# Patient Record
Sex: Female | Born: 1935 | Race: White | Hispanic: No | Marital: Single | Smoking: Never smoker
Health system: Southern US, Community
[De-identification: ages and names within clinical notes are randomized; demographics above are authoritative.]

## PROBLEM LIST (undated history)

## (undated) DIAGNOSIS — M1990 Unspecified osteoarthritis, unspecified site: Secondary | ICD-10-CM

## (undated) DIAGNOSIS — E119 Type 2 diabetes mellitus without complications: Secondary | ICD-10-CM

## (undated) DIAGNOSIS — I10 Essential (primary) hypertension: Secondary | ICD-10-CM

## (undated) DIAGNOSIS — C801 Malignant (primary) neoplasm, unspecified: Secondary | ICD-10-CM

## (undated) HISTORY — PX: TONSILLECTOMY: SUR1361

## (undated) HISTORY — PX: ABDOMINAL HYSTERECTOMY: SHX81

---

## 1986-06-17 HISTORY — PX: BREAST SURGERY: SHX581

## 1998-02-02 ENCOUNTER — Emergency Department (HOSPITAL_COMMUNITY): Admission: EM | Admit: 1998-02-02 | Discharge: 1998-02-02 | Payer: Self-pay | Admitting: Family Medicine

## 2012-06-08 ENCOUNTER — Emergency Department (HOSPITAL_COMMUNITY): Payer: Medicare Other

## 2012-06-08 ENCOUNTER — Encounter (HOSPITAL_COMMUNITY): Payer: Self-pay | Admitting: Emergency Medicine

## 2012-06-08 ENCOUNTER — Observation Stay (HOSPITAL_COMMUNITY)
Admission: EM | Admit: 2012-06-08 | Discharge: 2012-06-11 | DRG: 193 | Disposition: A | Discharge status: Home / Self Care | Payer: Medicare Other | Attending: Internal Medicine | Admitting: Internal Medicine

## 2012-06-08 DIAGNOSIS — J189 Pneumonia, unspecified organism: Principal | ICD-10-CM | POA: Insufficient documentation

## 2012-06-08 DIAGNOSIS — Z9221 Personal history of antineoplastic chemotherapy: Secondary | ICD-10-CM | POA: Insufficient documentation

## 2012-06-08 DIAGNOSIS — Z9010 Acquired absence of unspecified breast and nipple: Secondary | ICD-10-CM | POA: Insufficient documentation

## 2012-06-08 DIAGNOSIS — D72829 Elevated white blood cell count, unspecified: Secondary | ICD-10-CM

## 2012-06-08 DIAGNOSIS — R4182 Altered mental status, unspecified: Secondary | ICD-10-CM

## 2012-06-08 DIAGNOSIS — Z7982 Long term (current) use of aspirin: Secondary | ICD-10-CM | POA: Insufficient documentation

## 2012-06-08 DIAGNOSIS — I10 Essential (primary) hypertension: Secondary | ICD-10-CM | POA: Diagnosis present

## 2012-06-08 DIAGNOSIS — Z853 Personal history of malignant neoplasm of breast: Secondary | ICD-10-CM | POA: Insufficient documentation

## 2012-06-08 DIAGNOSIS — E785 Hyperlipidemia, unspecified: Secondary | ICD-10-CM | POA: Insufficient documentation

## 2012-06-08 DIAGNOSIS — G9340 Encephalopathy, unspecified: Secondary | ICD-10-CM | POA: Insufficient documentation

## 2012-06-08 DIAGNOSIS — Z923 Personal history of irradiation: Secondary | ICD-10-CM | POA: Insufficient documentation

## 2012-06-08 DIAGNOSIS — E119 Type 2 diabetes mellitus without complications: Secondary | ICD-10-CM | POA: Diagnosis present

## 2012-06-08 HISTORY — DX: Essential (primary) hypertension: I10

## 2012-06-08 HISTORY — DX: Malignant (primary) neoplasm, unspecified: C80.1

## 2012-06-08 HISTORY — DX: Unspecified osteoarthritis, unspecified site: M19.90

## 2012-06-08 HISTORY — DX: Type 2 diabetes mellitus without complications: E11.9

## 2012-06-08 LAB — COMPREHENSIVE METABOLIC PANEL
AST: 17 U/L (ref 0–37)
Albumin: 3.5 g/dL (ref 3.5–5.2)
Alkaline Phosphatase: 54 U/L (ref 39–117)
BUN: 18 mg/dL (ref 6–23)
CO2: 22 mEq/L (ref 19–32)
Chloride: 98 mEq/L (ref 96–112)
GFR calc non Af Amer: 45 mL/min — ABNORMAL LOW (ref >90)
Potassium: 3.5 mEq/L (ref 3.5–5.1)
Total Bilirubin: 0.5 mg/dL (ref 0.3–1.2)

## 2012-06-08 LAB — DIFFERENTIAL
Basophils Absolute: 0 10*3/uL (ref 0.0–0.1)
Basophils Relative: 0 % (ref 0–1)
Neutro Abs: 9.4 10*3/uL — ABNORMAL HIGH (ref 1.7–7.7)
Neutrophils Relative %: 74 % (ref 43–77)

## 2012-06-08 LAB — URINE MICROSCOPIC-ADD ON

## 2012-06-08 LAB — URINALYSIS, ROUTINE W REFLEX MICROSCOPIC
Glucose, UA: NEGATIVE mg/dL
Ketones, ur: 15 mg/dL — AB
Protein, ur: NEGATIVE mg/dL
Urobilinogen, UA: 0.2 mg/dL (ref 0.0–1.0)

## 2012-06-08 LAB — CBC
HCT: 39.1 % (ref 36.0–46.0)
MCV: 95.1 fL (ref 78.0–100.0)
RBC: 4.11 MIL/uL (ref 3.87–5.11)
RDW: 13.5 % (ref 11.5–15.5)
WBC: 15.2 10*3/uL — ABNORMAL HIGH (ref 4.0–10.5)

## 2012-06-08 MED ORDER — DEXTROSE 5 % IV SOLN
1.0000 g | Freq: Once | INTRAVENOUS | Status: AC
  Administered 2012-06-08: 1 g via INTRAVENOUS
  Filled 2012-06-08: qty 10

## 2012-06-08 MED ORDER — AZITHROMYCIN 500 MG IV SOLR
500.0000 mg | Freq: Once | INTRAVENOUS | Status: AC
  Administered 2012-06-08: 500 mg via INTRAVENOUS
  Filled 2012-06-08: qty 500

## 2012-06-08 MED ORDER — ACETAMINOPHEN 650 MG RE SUPP
650.0000 mg | Freq: Once | RECTAL | Status: AC
  Administered 2012-06-08: 650 mg via RECTAL
  Filled 2012-06-08: qty 1

## 2012-06-08 MED ORDER — SODIUM CHLORIDE 0.9 % IV BOLUS (SEPSIS)
500.0000 mL | Freq: Once | INTRAVENOUS | Status: AC
  Administered 2012-06-08: 500 mL via INTRAVENOUS

## 2012-06-08 MED ORDER — SODIUM CHLORIDE 0.9 % IV SOLN
Freq: Once | INTRAVENOUS | Status: AC
  Administered 2012-06-08: via INTRAVENOUS

## 2012-06-08 NOTE — ED Notes (Signed)
Pt ambulated to restroom with no assistance. ?

## 2012-06-08 NOTE — ED Notes (Signed)
Pt returned from radiology. Lab at bedside to collect blood.

## 2012-06-08 NOTE — ED Notes (Signed)
Per EMS - pt started having difficulty putting together words. LSN 545pm today. No facial droop, equal hand grips. No urinary symptoms. Pt A&Ox4. Pt's brother was concerned bc pt was able to put words together and normal she has no difficulty. No slurred speech. Family reports hx of stroke, pt denies. BP 124/99 HR 90 nsr. CBG 136. Pt c/o HA for a couple of days. Daughter has the flu.

## 2012-06-08 NOTE — ED Notes (Signed)
Pt's brother reports he talked with pt on phone around 530pm when he noticed she couldn't remember anything and wasn't able to get her words out. Pt's ex husband reports that he brought her lunch around noon and she was normal at that point. Pt reports she didn't see anyone in between noon and talking on the phone at 530pm.

## 2012-06-09 ENCOUNTER — Encounter (HOSPITAL_COMMUNITY): Payer: Self-pay | Admitting: Internal Medicine

## 2012-06-09 DIAGNOSIS — E785 Hyperlipidemia, unspecified: Secondary | ICD-10-CM | POA: Diagnosis present

## 2012-06-09 DIAGNOSIS — I10 Essential (primary) hypertension: Secondary | ICD-10-CM | POA: Diagnosis present

## 2012-06-09 DIAGNOSIS — G9340 Encephalopathy, unspecified: Secondary | ICD-10-CM | POA: Diagnosis present

## 2012-06-09 DIAGNOSIS — J189 Pneumonia, unspecified organism: Secondary | ICD-10-CM | POA: Diagnosis present

## 2012-06-09 DIAGNOSIS — E119 Type 2 diabetes mellitus without complications: Secondary | ICD-10-CM

## 2012-06-09 DIAGNOSIS — Z853 Personal history of malignant neoplasm of breast: Secondary | ICD-10-CM

## 2012-06-09 LAB — CBC WITH DIFFERENTIAL/PLATELET
Basophils Relative: 0 % (ref 0–1)
Hemoglobin: 12.5 g/dL (ref 12.0–15.0)
Lymphocytes Relative: 11 % — ABNORMAL LOW (ref 12–46)
Lymphs Abs: 1.4 10*3/uL (ref 0.7–4.0)
Monocytes Relative: 11 % (ref 3–12)
Neutro Abs: 9 10*3/uL — ABNORMAL HIGH (ref 1.7–7.7)
Neutrophils Relative %: 74 % (ref 43–77)
RBC: 3.81 MIL/uL — ABNORMAL LOW (ref 3.87–5.11)
WBC: 12.1 10*3/uL — ABNORMAL HIGH (ref 4.0–10.5)

## 2012-06-09 LAB — COMPREHENSIVE METABOLIC PANEL
CO2: 24 mEq/L (ref 19–32)
Calcium: 9 mg/dL (ref 8.4–10.5)
Creatinine, Ser: 0.95 mg/dL (ref 0.50–1.10)
GFR calc Af Amer: 65 mL/min — ABNORMAL LOW (ref >90)
GFR calc non Af Amer: 56 mL/min — ABNORMAL LOW (ref >90)
Glucose, Bld: 150 mg/dL — ABNORMAL HIGH (ref 70–99)
Total Protein: 7.5 g/dL (ref 6.0–8.3)

## 2012-06-09 LAB — INFLUENZA PANEL BY PCR (TYPE A & B)
H1N1 flu by pcr: NOT DETECTED
Influenza A By PCR: NEGATIVE
Influenza B By PCR: NEGATIVE

## 2012-06-09 LAB — GLUCOSE, CAPILLARY
Glucose-Capillary: 172 mg/dL — ABNORMAL HIGH (ref 70–99)
Glucose-Capillary: 179 mg/dL — ABNORMAL HIGH (ref 70–99)
Glucose-Capillary: 123 mg/dL — ABNORMAL HIGH (ref 70–99)

## 2012-06-09 MED ORDER — PROPRANOLOL HCL ER BEADS 120 MG PO CP24: 120.0000 mg | ORAL_CAPSULE | Freq: Every day | ORAL | Status: DC

## 2012-06-09 MED ORDER — INSULIN GLARGINE 100 UNIT/ML ~~LOC~~ SOLN
20.0000 [IU] | Freq: Every day | SUBCUTANEOUS | Status: DC
  Administered 2012-06-09 – 2012-06-10 (×2): 20 [IU] via SUBCUTANEOUS

## 2012-06-09 MED ORDER — PANTOPRAZOLE SODIUM 40 MG PO TBEC
40.0000 mg | DELAYED_RELEASE_TABLET | Freq: Every day | ORAL | Status: DC
  Administered 2012-06-09 – 2012-06-10 (×2): 40 mg via ORAL
  Filled 2012-06-09: qty 1

## 2012-06-09 MED ORDER — MOMETASONE FURO-FORMOTEROL FUM 100-5 MCG/ACT IN AERO
2.0000 | INHALATION_SPRAY | Freq: Two times a day (BID) | RESPIRATORY_TRACT | Status: DC
  Administered 2012-06-09 – 2012-06-11 (×5): 2 via RESPIRATORY_TRACT
  Filled 2012-06-09: qty 8.8

## 2012-06-09 MED ORDER — ENOXAPARIN SODIUM 40 MG/0.4ML ~~LOC~~ SOLN
40.0000 mg | SUBCUTANEOUS | Status: DC
  Administered 2012-06-09 – 2012-06-10 (×2): 40 mg via SUBCUTANEOUS
  Filled 2012-06-09 (×3): qty 0.4

## 2012-06-09 MED ORDER — ONDANSETRON HCL 4 MG/2ML IJ SOLN: 4.0000 mg | Freq: Four times a day (QID) | INTRAMUSCULAR | Status: DC | PRN

## 2012-06-09 MED ORDER — ACETAMINOPHEN 325 MG PO TABS
650.0000 mg | ORAL_TABLET | Freq: Four times a day (QID) | ORAL | Status: DC | PRN
  Administered 2012-06-09 – 2012-06-10 (×2): 650 mg via ORAL
  Filled 2012-06-09 (×2): qty 2

## 2012-06-09 MED ORDER — ACETAMINOPHEN 650 MG RE SUPP: 650.0000 mg | Freq: Four times a day (QID) | RECTAL | Status: DC | PRN

## 2012-06-09 MED ORDER — LETROZOLE 2.5 MG PO TABS
2.5000 mg | ORAL_TABLET | Freq: Every day | ORAL | Status: DC
  Administered 2012-06-09 – 2012-06-10 (×2): 2.5 mg via ORAL
  Filled 2012-06-09 (×3): qty 1

## 2012-06-09 MED ORDER — POTASSIUM CHLORIDE CRYS ER 20 MEQ PO TBCR
40.0000 meq | EXTENDED_RELEASE_TABLET | Freq: Two times a day (BID) | ORAL | Status: AC
  Administered 2012-06-09 (×2): 40 meq via ORAL
  Filled 2012-06-09 (×2): qty 2

## 2012-06-09 MED ORDER — BIOTENE DRY MOUTH MT LIQD
15.0000 mL | Freq: Two times a day (BID) | OROMUCOSAL | Status: DC
  Administered 2012-06-10 – 2012-06-11 (×3): 15 mL via OROMUCOSAL

## 2012-06-09 MED ORDER — INSULIN ASPART 100 UNIT/ML ~~LOC~~ SOLN
0.0000 [IU] | Freq: Three times a day (TID) | SUBCUTANEOUS | Status: DC
  Administered 2012-06-09 (×2): 2 [IU] via SUBCUTANEOUS
  Administered 2012-06-10 (×2): 1 [IU] via SUBCUTANEOUS
  Administered 2012-06-10: 2 [IU] via SUBCUTANEOUS
  Administered 2012-06-11: 09:00:00 via SUBCUTANEOUS

## 2012-06-09 MED ORDER — SODIUM CHLORIDE 0.9 % IV SOLN: INTRAVENOUS | Status: DC

## 2012-06-09 MED ORDER — ALBUTEROL SULFATE (5 MG/ML) 0.5% IN NEBU: 2.5000 mg | INHALATION_SOLUTION | RESPIRATORY_TRACT | Status: AC | PRN

## 2012-06-09 MED ORDER — PROPRANOLOL HCL ER 120 MG PO CP24
120.0000 mg | ORAL_CAPSULE | Freq: Every day | ORAL | Status: DC
  Administered 2012-06-09: 120 mg via ORAL
  Filled 2012-06-09 (×3): qty 1

## 2012-06-09 MED ORDER — DEXTROSE 5 % IV SOLN
1.0000 g | INTRAVENOUS | Status: DC
  Administered 2012-06-09: 1 g via INTRAVENOUS
  Filled 2012-06-09: qty 10

## 2012-06-09 MED ORDER — ASPIRIN EC 81 MG PO TBEC
81.0000 mg | DELAYED_RELEASE_TABLET | Freq: Every day | ORAL | Status: DC
  Administered 2012-06-09 – 2012-06-10 (×2): 81 mg via ORAL
  Filled 2012-06-09 (×3): qty 1

## 2012-06-09 MED ORDER — GUAIFENESIN 100 MG/5ML PO SYRP
200.0000 mg | ORAL_SOLUTION | ORAL | Status: DC | PRN
  Administered 2012-06-09: 200 mg via ORAL
  Filled 2012-06-09: qty 118

## 2012-06-09 MED ORDER — ONDANSETRON HCL 4 MG PO TABS: 4.0000 mg | ORAL_TABLET | Freq: Four times a day (QID) | ORAL | Status: DC | PRN

## 2012-06-09 MED ORDER — SODIUM CHLORIDE 0.9 % IV SOLN
INTRAVENOUS | Status: AC
  Administered 2012-06-09: 03:00:00 via INTRAVENOUS

## 2012-06-09 MED ORDER — DEXTROSE 5 % IV SOLN
500.0000 mg | INTRAVENOUS | Status: DC
  Administered 2012-06-09: 500 mg via INTRAVENOUS
  Filled 2012-06-09: qty 500

## 2012-06-09 MED ORDER — SODIUM CHLORIDE 0.9 % IJ SOLN
3.0000 mL | Freq: Two times a day (BID) | INTRAMUSCULAR | Status: DC
  Administered 2012-06-09 – 2012-06-10 (×3): 3 mL via INTRAVENOUS

## 2012-06-09 MED ORDER — ATORVASTATIN CALCIUM 10 MG PO TABS
10.0000 mg | ORAL_TABLET | Freq: Every day | ORAL | Status: DC
  Administered 2012-06-09 – 2012-06-10 (×2): 10 mg via ORAL
  Filled 2012-06-09 (×3): qty 1

## 2012-06-09 MED ORDER — LOSARTAN POTASSIUM 50 MG PO TABS
100.0000 mg | ORAL_TABLET | Freq: Every day | ORAL | Status: DC
  Administered 2012-06-09 – 2012-06-10 (×2): 100 mg via ORAL
  Filled 2012-06-09 (×3): qty 2

## 2012-06-09 MED ORDER — GLIPIZIDE ER 10 MG PO TB24
10.0000 mg | ORAL_TABLET | Freq: Every day | ORAL | Status: DC
Start: 2012-06-09 — End: 2012-06-11
  Administered 2012-06-09 – 2012-06-11 (×3): 10 mg via ORAL
  Filled 2012-06-09 (×4): qty 1

## 2012-06-09 MED ORDER — LOSARTAN POTASSIUM 50 MG PO TABS: 100.0000 mg | ORAL_TABLET | Freq: Every day | ORAL | Status: DC

## 2012-06-09 MED ORDER — ONDANSETRON HCL 4 MG/2ML IJ SOLN: 4.0000 mg | Freq: Three times a day (TID) | INTRAMUSCULAR | Status: AC | PRN

## 2012-06-09 NOTE — ED Notes (Signed)
Pt states she noted a burning sensation around IV site, no redness or swelling noted, blood return from IV, pt denies itching or allergic reaction symptoms. IV antibiotic rate slowed for patient comfort.

## 2012-06-09 NOTE — Progress Notes (Signed)
Utilization review completed.  

## 2012-06-09 NOTE — Evaluation (Signed)
Physical Therapy Evaluation Patient Details Name: Shelley Glover MRN: [id number] DOB: [birth year] Today's Date: 06/09/2012 Time: 1025-1040 PT Time Calculation (min): 15 min  PT Assessment / Plan / Recommendation Clinical Impression  Pt is a 78 y/o female admitted with PNA and confusion (CT negative for acute event) along with the below PT problem list. Pt would benefit from acute PT to ensure maximized independence and facilitate d/c home without PT follow-up at d/c.    PT Assessment  Patient needs continued PT services    Follow Up Recommendations  No PT follow up    Does the patient have the potential to tolerate intense rehabilitation      Barriers to Discharge None      Equipment Recommendations  None recommended by PT    Recommendations for Other Services     Frequency Min 3X/week    Precautions / Restrictions Precautions Precautions: Fall Restrictions Weight Bearing Restrictions: No   Pertinent Vitals/Pain None      Mobility  Bed Mobility Bed Mobility: Supine to Sit Supine to Sit: 4: Min assist;HOB flat Details for Bed Mobility Assistance: Assist for trunk to translate anterior with pt able to move bilateral LEs to EOB without assist. Cues for sequence. Transfers Transfers: Sit to Stand;Stand to Sit Sit to Stand: 4: Min assist;With upper extremity assist;From bed Stand to Sit: 4: Min assist;With upper extremity assist;To chair/3-in-1 Details for Transfer Assistance: Assist for balance with cues for safest hand placement. Ambulation/Gait Ambulation/Gait Assistance: 4: Min assist Ambulation Distance (Feet): 70 Feet Assistive device: 1 person hand held assist Ambulation/Gait Assistance Details: Assist for balance with cues for safety and tall posture. Ambulated on RA with O2 sats stable in mid 90% throughout. Gait Pattern: Step-through pattern;Decreased stride length Stairs: No Wheelchair Mobility Wheelchair Mobility: No    Shoulder Instructions      Exercises     PT Diagnosis: Difficulty walking;Generalized weakness  PT Problem List: Decreased strength;Decreased activity tolerance;Decreased balance;Decreased mobility PT Treatment Interventions: Gait training;Stair training;Functional mobility training;Therapeutic activities;Balance training;Patient/family education   PT Goals Acute Rehab PT Goals PT Goal Formulation: With patient/family Time For Goal Achievement: 06/16/12 Potential to Achieve Goals: Good Pt will go Supine/Side to Sit: with modified independence PT Goal: Supine/Side to Sit - Progress: Goal set today Pt will go Sit to Supine/Side: with modified independence PT Goal: Sit to Supine/Side - Progress: Goal set today Pt will go Sit to Stand: with modified independence PT Goal: Sit to Stand - Progress: Goal set today Pt will go Stand to Sit: with modified independence PT Goal: Stand to Sit - Progress: Goal set today Pt will Ambulate: >150 feet;with modified independence;with least restrictive assistive device PT Goal: Ambulate - Progress: Goal set today Pt will Go Up / Down Stairs: 3-5 stairs;with modified independence;with least restrictive assistive device PT Goal: Up/Down Stairs - Progress: Goal set today  Visit Information  Last PT Received On: 06/09/12 Assistance Needed: +1    Subjective Data  Subjective: "Doing much better today." Patient Stated Goal: Get better.   Prior Functioning  Home Living Lives With: Alone Available Help at Discharge: Family;Available PRN/intermittently Type of Home: House Home Access: Stairs to enter [employer] of Steps: 3 Entrance Stairs-Rails: Can reach both Home Layout: One level Home Adaptive Equipment: None Prior Function Level of Independence: Independent Able to Take Stairs?: Yes Driving: Yes Vocation: Retired Musician: No difficulties Dominant Hand: Right    Cognition  Overall Cognitive Status: Appears within functional limits for  tasks assessed/performed Arousal/Alertness: Awake/alert  Orientation Level: Appears intact for tasks assessed Behavior During Session: [facility] for tasks performed    Extremity/Trunk Assessment Right Upper Extremity Assessment RUE ROM/Strength/Tone: Within functional levels RUE Sensation: WFL - Light Touch RUE Coordination: WFL - gross motor Left Upper Extremity Assessment LUE ROM/Strength/Tone: Within functional levels LUE Sensation: WFL - Light Touch LUE Coordination: WFL - gross motor Right Lower Extremity Assessment RLE ROM/Strength/Tone: Within functional levels RLE Sensation: WFL - Light Touch RLE Coordination: WFL - gross motor Left Lower Extremity Assessment LLE ROM/Strength/Tone: Within functional levels LLE Sensation: WFL - Light Touch LLE Coordination: WFL - gross motor Trunk Assessment Trunk Assessment: Normal   Balance Balance Balance Assessed: No  End of Session PT - End of Session Equipment Utilized During Treatment: Gait belt Activity Tolerance: Patient tolerated treatment well Patient left: in chair;with call bell/phone within reach;with family/visitor present Nurse Communication: Mobility status  GP     Cephus Shelling 06/09/2012, 10:43 AM  06/09/2012 Cephus Shelling, PT, DPT [phone]

## 2012-06-09 NOTE — Progress Notes (Signed)
[facility] PROGRESS NOTE  Assessment/Plan: Encephalopathy acute (06/09/2012) - resolved. -This is most likely secondary to her community acquired pneumonia. - CT scan of her head did not show any acute infarct. On physical exam she is nonfocal.  Community acquired pneumonia (06/09/2012) - afebrile, leukocytosis improving.  - 06/08/2012 Rocephin and azithromycin.  HTN (hypertension) (06/09/2012) - resume home medications.  Hyperlipidemia (06/09/2012) - continue statins.  Diabetes mellitus (06/09/2012) - blood glucose running high start her on Lantus, continue sliding scale insulin. -To be started on home med regimen as an outpatient.    Code Status: full Family Communication: son  Disposition Plan: Home 2-3 days   Consultants:  none  Procedures:  none  Antibiotics: Azithromycin and Rocephin 06/09/2012.  HPI/Subjective: Cough, no shortness of breath. She relates she's more with it today.  Objective: Filed Vitals:   06/09/12 0100 06/09/12 0200 06/09/12 0400 06/09/12 0600  BP: 130/58 136/88 150/86 158/60  Pulse: 86 95    Temp:  98.4 F (36.9 C)    TempSrc:  Oral    Resp: 26     Height:  5\' 3"  (1.6 m)    Weight:  83.825 kg (184 lb 12.8 oz)    SpO2: 92% 98%     No intake or output data in the 24 hours ending 06/09/12 0838 Filed Weights   06/09/12 0200  Weight: 83.825 kg (184 lb 12.8 oz)    Exam:  General: Alert, awake, oriented x3, in no acute distress.  HEENT: No bruits, no goiter.  Heart: Regular rate and rhythm, without murmurs, rubs, gallops.  Lungs: Good air movement, bilateral rhonchi. Abdomen: Soft, nontender, nondistended, positive bowel sounds.  Neuro: Grossly intact, nonfocal. Facial droop old    Data Reviewed: Basic Metabolic Panel:  Lab 06/09/12 1610 06/08/12 2114  NA 138 135  K 3.2* 3.5  CL 99 98  CO2 24 22  GLUCOSE 150* 148*  BUN 13 18  CREATININE 0.95 1.13*  CALCIUM 9.0 9.5  MG -- --  PHOS -- --   Liver Function  Tests:  Lab 06/09/12 0535 06/08/12 2114  AST 15 17  ALT 14 16  ALKPHOS 54 54  BILITOT 0.7 0.5  PROT 7.5 7.8  ALBUMIN 3.2* 3.5   No results found for this basename: LIPASE:5,AMYLASE:5 in the last 168 hours No results found for this basename: AMMONIA:5 in the last 168 hours CBC:  Lab 06/09/12 0535 06/08/12 2235 06/08/12 2114  WBC 12.1* -- 15.2*  NEUTROABS 9.0* 9.4* --  HGB 12.5 -- 13.3  HCT 35.7* -- 39.1  MCV 93.7 -- 95.1  PLT 210 -- 217   Cardiac Enzymes: No results found for this basename: CKTOTAL:5,CKMB:5,CKMBINDEX:5,TROPONINI:5 in the last 168 hours BNP (last 3 results) No results found for this basename: PROBNP:3 in the last 8760 hours CBG:  Lab 06/09/12 0750 06/09/12 0207  GLUCAP 172* 205*    No results found for this or any previous visit (from the past 240 hour(s)).   Studies: Dg Chest 2 View  06/08/2012  *RADIOLOGY REPORT*  Clinical Data: Cough, altered mental status  CHEST - 2 VIEW  Comparison: None.  Findings: Patchy right apical opacity, possibly reflecting pneumonia.  If chronic, this appearance could also reflect pleural parenchymal scarring.  Underlying mass is considered less likely.  No pleural effusion or pneumothorax.  Cardiomediastinal silhouette is within normal limits.  Degenerative changes of the visualized thoracolumbar spine.  Surgical clips in the bilateral chest wall / axilla.  IMPRESSION: Patchy right apical opacity, possibly reflecting  pneumonia. Pleural parenchymal scarring is also possible.  Follow-up chest radiographs are suggested to assess for resolution. If persistent, consider CT.   Original Report Authenticated By: Charline Bills, M.D.    Ct Head Wo Contrast  06/08/2012  *RADIOLOGY REPORT*  Clinical Data: Altered mental status.  CT HEAD WITHOUT CONTRAST  Technique:  Contiguous axial images were obtained from the base of the skull through the vertex without contrast.  Comparison: None.  Findings: There is no evidence of acute infarction,  mass lesion, or intra- or extra-axial hemorrhage on CT.  Mild scattered subcortical white matter change likely reflects small vessel ischemic microangiopathy.  The posterior fossa, including the cerebellum, brainstem and fourth ventricle, is within normal limits.  The third and lateral ventricles, and basal ganglia are unremarkable in appearance.  The cerebral hemispheres demonstrate grossly normal gray-white differentiation.  No mass effect or midline shift is seen.  There is no evidence of fracture; hyperostosis frontalis interna is noted.  The orbits are within normal limits.  The paranasal sinuses and mastoid air cells are well-aerated.  No significant soft tissue abnormalities are seen.  IMPRESSION:  1.  No acute intracranial pathology seen on CT. 2.  Mild scattered small vessel ischemic microangiopathy noted.   Original Report Authenticated By: Tonia Ghent, M.D.     Scheduled Meds:   . sodium chloride   Intravenous STAT  . antiseptic oral rinse  15 mL Mouth Rinse BID  . aspirin EC  81 mg Oral Daily  . atorvastatin  10 mg Oral q1800  . azithromycin  500 mg Intravenous Q24H  . cefTRIAXone (ROCEPHIN)  IV  1 g Intravenous Q24H  . enoxaparin (LOVENOX) injection  40 mg Subcutaneous Q24H  . glipiZIDE  10 mg Oral Q breakfast  . insulin aspart  0-9 Units Subcutaneous TID WC  . letrozole  2.5 mg Oral Daily  . losartan  100 mg Oral Daily  . mometasone-formoterol  2 puff Inhalation BID  . pantoprazole  40 mg Oral Daily  . propranolol ER  120 mg Oral QHS  . sodium chloride  3 mL Intravenous Q12H   Continuous Infusions:   . sodium chloride       Marinda Elk  [facility] Pager [phone]. If 8PM-8AM, please contact night-coverage at [email or web address], password [facility] 06/09/2012, 8:38 AM  LOS: 1 day

## 2012-06-09 NOTE — ED Provider Notes (Signed)
History     CSN: [id number]  Arrival date & time 06/08/12  2043   First MD Initiated Contact with Patient 06/08/12 2205      Chief Complaint  Patient presents with  . Altered Mental Status    (Consider location/radiation/quality/duration/timing/severity/associated sxs/prior treatment) HPI Shelley Glover is a 78 y.o. female who presents with complaint of difficulty speaking onset around 6pm tonight. Pt was on the phone with her husband at that time. Pt states she is not sure what happened but remembers not being able to make words and was confused. Pt also reports URI symptoms and cough for the last 4-5 days. States cough worsened in the last two days. Fever, chills at home, nasal congestion, sore throat. No chest pain, abdominal pain, n/v/d. Symptoms improved by the time EMS arrived. Pt back to baseline now as far as difficulty speaking goes. No other neuro deficits reported. Pt denies hx of a stroke to me. PCP at [facility].  Past Medical History  Diagnosis Date  . Diabetes mellitus without complication   . Hypertension   . Arthritis   . Cancer     breast CA and skin CA     Past Surgical History  Procedure Date  . Abdominal hysterectomy   . Breast surgery 1988    bilateral    No family history on file.  History  Substance Use Topics  . Smoking status: Never Smoker   . Smokeless tobacco: Not on file  . Alcohol Use: No    OB History    Grav Para Term Preterm Abortions TAB SAB Ect Mult Living                  Review of Systems  Constitutional: Positive for fever and chills.  HENT: Positive for congestion and sore throat. Negative for ear pain, neck pain and neck stiffness.   Respiratory: Positive for cough and shortness of breath.   Cardiovascular: Negative for chest pain and leg swelling.  Gastrointestinal: Negative.   Musculoskeletal: Positive for myalgias and arthralgias.  Skin: Negative for rash.  Neurological: Positive for headaches. Negative for dizziness and  weakness.  All other systems reviewed and are negative.    Allergies  Review of patient's allergies indicates no known allergies.  Home Medications  No current outpatient prescriptions on file.  BP 111/92  Pulse 88  Temp 100.9 F (38.3 C) (Rectal)  Resp 19  SpO2 94%  Physical Exam  Nursing note and vitals reviewed. Constitutional: She is oriented to person, place, and time. She appears well-developed and well-nourished.       Uncomfortable appearing  HENT:  Head: Normocephalic and atraumatic.  Right Ear: External ear normal.  Left Ear: External ear normal.  Nose: Nose normal.  Mouth/Throat: Oropharynx is clear and moist.  Eyes: Conjunctivae normal and EOM are normal. Pupils are equal, round, and reactive to light.  Neck: Normal range of motion. Neck supple.       No nuchal rigidity  Cardiovascular: Normal rate, regular rhythm and normal heart sounds.   Pulmonary/Chest: Effort normal. No respiratory distress. She has wheezes. She has no rales.       Diffuse expiratory wheezes bilaterally  Abdominal: Soft. Bowel sounds are normal. She exhibits no distension. There is no tenderness. There is no rebound.  Musculoskeletal: Normal range of motion. She exhibits edema.       2+ pitting LE edema bilaterally  Lymphadenopathy:    She has no cervical adenopathy.  Neurological: She is alert and  oriented to person, place, and time. No cranial nerve deficit.       5/5 and equal upper and lower extremity strength bilaterally. Equal grip strength bilaterally. Normal finger to nose and heel to shin. No pronator drift.   Skin: Skin is warm and dry.  Psychiatric: She has a normal mood and affect.    ED Course  Procedures (including critical care time)  Pt with dysarthria earlier around 6pm, now resolved. She has had URI symptoms, cough, SOB. Here low grade fever 100.7. Tylenol ordered. Labs CT head.   Results for orders placed during the hospital encounter of 06/08/12  CBC       Component Value Range   WBC 15.2 (*) 4.0 - 10.5 K/uL   RBC 4.11  3.87 - 5.11 MIL/uL   Hemoglobin 13.3  12.0 - 15.0 g/dL   HCT 84.1  32.4 - 40.1 %   MCV 95.1  78.0 - 100.0 fL   MCH 32.4  26.0 - 34.0 pg   MCHC 34.0  30.0 - 36.0 g/dL   RDW 02.7  25.3 - 66.4 %   Platelets 217  150 - 400 K/uL  COMPREHENSIVE METABOLIC PANEL      Component Value Range   Sodium 135  135 - 145 mEq/L   Potassium 3.5  3.5 - 5.1 mEq/L   Chloride 98  96 - 112 mEq/L   CO2 22  19 - 32 mEq/L   Glucose, Bld 148 (*) 70 - 99 mg/dL   BUN 18  6 - 23 mg/dL   Creatinine, Ser 4.03 (*) 0.50 - 1.10 mg/dL   Calcium 9.5  8.4 - 47.4 mg/dL   Total Protein 7.8  6.0 - 8.3 g/dL   Albumin 3.5  3.5 - 5.2 g/dL   AST 17  0 - 37 U/L   ALT 16  0 - 35 U/L   Alkaline Phosphatase 54  39 - 117 U/L   Total Bilirubin 0.5  0.3 - 1.2 mg/dL   GFR calc non Af Amer 45 (*) >90 mL/min   GFR calc Af Amer 53 (*) >90 mL/min  URINALYSIS, ROUTINE W REFLEX MICROSCOPIC      Component Value Range   Color, Urine YELLOW  YELLOW   APPearance CLEAR  CLEAR   Specific Gravity, Urine 1.019  1.005 - 1.030   pH 5.5  5.0 - 8.0   Glucose, UA NEGATIVE  NEGATIVE mg/dL   Hgb urine dipstick TRACE (*) NEGATIVE   Bilirubin Urine NEGATIVE  NEGATIVE   Ketones, ur 15 (*) NEGATIVE mg/dL   Protein, ur NEGATIVE  NEGATIVE mg/dL   Urobilinogen, UA 0.2  0.0 - 1.0 mg/dL   Nitrite NEGATIVE  NEGATIVE   Leukocytes, UA NEGATIVE  NEGATIVE  URINE MICROSCOPIC-ADD ON      Component Value Range   Squamous Epithelial / LPF RARE  RARE   WBC, UA 0-2  <3 WBC/hpf   RBC / HPF 0-2  <3 RBC/hpf   Bacteria, UA RARE  RARE   Casts HYALINE CASTS (*) NEGATIVE   Urine-Other LESS THAN 10 mL OF URINE SUBMITTED    DIFFERENTIAL      Component Value Range   Neutrophils Relative 74  43 - 77 %   Neutro Abs 9.4 (*) 1.7 - 7.7 K/uL   Lymphocytes Relative 14  12 - 46 %   Lymphs Abs 1.7  0.7 - 4.0 K/uL   Monocytes Relative 10  3 - 12 %   Monocytes Absolute 1.3 (*) 0.1 -  1.0 K/uL   Eosinophils  Relative 2  0 - 5 %   Eosinophils Absolute 0.3  0.0 - 0.7 K/uL   Basophils Relative 0  0 - 1 %   Basophils Absolute 0.0  0.0 - 0.1 K/uL   Dg Chest 2 View  06/08/2012  *RADIOLOGY REPORT*  Clinical Data: Cough, altered mental status  CHEST - 2 VIEW  Comparison: None.  Findings: Patchy right apical opacity, possibly reflecting pneumonia.  If chronic, this appearance could also reflect pleural parenchymal scarring.  Underlying mass is considered less likely.  No pleural effusion or pneumothorax.  Cardiomediastinal silhouette is within normal limits.  Degenerative changes of the visualized thoracolumbar spine.  Surgical clips in the bilateral chest wall / axilla.  IMPRESSION: Patchy right apical opacity, possibly reflecting pneumonia. Pleural parenchymal scarring is also possible.  Follow-up chest radiographs are suggested to assess for resolution. If persistent, consider CT.   Original Report Authenticated By: Charline Bills, M.D.    Ct Head Wo Contrast  06/08/2012  *RADIOLOGY REPORT*  Clinical Data: Altered mental status.  CT HEAD WITHOUT CONTRAST  Technique:  Contiguous axial images were obtained from the base of the skull through the vertex without contrast.  Comparison: None.  Findings: There is no evidence of acute infarction, mass lesion, or intra- or extra-axial hemorrhage on CT.  Mild scattered subcortical white matter change likely reflects small vessel ischemic microangiopathy.  The posterior fossa, including the cerebellum, brainstem and fourth ventricle, is within normal limits.  The third and lateral ventricles, and basal ganglia are unremarkable in appearance.  The cerebral hemispheres demonstrate grossly normal gray-white differentiation.  No mass effect or midline shift is seen.  There is no evidence of fracture; hyperostosis frontalis interna is noted.  The orbits are within normal limits.  The paranasal sinuses and mastoid air cells are well-aerated.  No significant soft tissue  abnormalities are seen.  IMPRESSION:  1.  No acute intracranial pathology seen on CT. 2.  Mild scattered small vessel ischemic microangiopathy noted.   Original Report Authenticated By: Tonia Ghent, M.D.     Pt feeling better with neb treatment. Covered for PNA with rocephin and zithromax. Influenza cultures sent. Her VS are normal. CT head negative. Pt will be admitted for further workup of prior AMS episode and treatment for her pneumonia  1. CAP (community acquired pneumonia)   2. Altered mental state   3. Leukocytosis       MDM  Pt with episode of dysarthria, now completely resolved. Normal neuro exam. She is however, febrile with cough, uri symptoms. CXR showing possible pna, covered with rocephin and zithromax. Influenza by pcr ordered. Possibly earlier episode due to pt's fever, however, may need further imaging with an MRI to r/o stroke. Pt not sure if she has ever had CVA before, denies at present, but family seems to think she may have had it in the past. No records here. Pt admitted [facility] for further evaluation.         Lottie Mussel, PA 06/09/12 [phone]

## 2012-06-09 NOTE — Evaluation (Signed)
Clinical/Bedside Swallow Evaluation Patient Details  Name: Shelley Glover MRN: [id number] Date of Birth: 27-Jul-1931  Today's Date: 06/09/2012 Time: 4098-1191 SLP Time Calculation (min): 12 min  Past Medical History:  Past Medical History  Diagnosis Date  . Diabetes mellitus without complication   . Hypertension   . Arthritis   . Cancer     breast CA and skin CA    Past Surgical History:  Past Surgical History  Procedure Date  . Abdominal hysterectomy   . Breast surgery 1988    bilateral  . Tonsillectomy    HPI:  78 yr old admitted with confusion.  CT negative.  Found to have pna and acute encephalopathy.  History of breast CA, radiation, chemo, DM2, HTN.  She reports a hiatal hernia and takes reflux medication. Pt. denies recent pna.  She complains of globus sensation in chest and reported she had difficulty initiating a swallow intermittently last week.     Assessment / Plan / Recommendation Clinical Impression  Pt. exhibited overall normal oropharyngeal swallow function with minimal and intermittent delayed throat clears.  No difficutly initiating swallow with liquids or solids today.  Suspect intermittent globus sensation likely from esophageal component.  Recommend she initiate a regular texture diet and thin liquids, pills with liquid, straws ok.  Reviewed esophageal precautions.  No follow up ST needed.    Aspiration Risk  Mild    Diet Recommendation Regular;Thin liquid   Liquid Administration via: Cup;Straw Medication Administration: Whole meds with liquid Supervision: Patient able to self feed Compensations: Slow rate;Small sips/bites;Follow solids with liquid Postural Changes and/or Swallow Maneuvers: Seated upright 90 degrees;Upright 30-60 min after meal    Other  Recommendations Oral Care Recommendations: Oral care BID   Follow Up Recommendations  None    Frequency and Duration             Swallow Study Prior Functional Status       General HPI: 78 yr  old admitted with confusion.  CT negative.  Found to have pna and acute encephalopathy.  History of breast CA, radiation, chemo, DM2, HTN.  She reports a hiatal hernia and takes reflux medication. Pt. denies recent pna.  She complains of globus sensation in chest and reported she had difficulty initiating a swallow intermittently last week.   Type of Study: Bedside swallow evaluation Diet Prior to this Study: NPO Temperature Spikes Noted: No Respiratory Status: Room air History of Recent Intubation: No Behavior/Cognition: Alert;Cooperative;Pleasant mood Oral Cavity - Dentition: Adequate natural dentition Self-Feeding Abilities: Able to feed self Patient Positioning: Upright in bed Baseline Vocal Quality: Clear Volitional Cough: Strong Volitional Swallow: Able to elicit    Oral/Motor/Sensory Function Overall Oral Motor/Sensory Function: Appears within functional limits for tasks assessed   Ice Chips Ice chips: Not tested   Thin Liquid Thin Liquid: Impaired Presentation: Cup Pharyngeal  Phase Impairments: Throat Clearing - Delayed    Nectar Thick Nectar Thick Liquid: Not tested   Honey Thick Honey Thick Liquid: Not tested   Puree Puree: Within functional limits   Solid   GO    Solid: Within functional limits       Royce Macadamia M.Ed [employer] [phone]  06/09/2012

## 2012-06-09 NOTE — H&P (Signed)
Shelley Glover is an 78 y.o. female.   Patient was seen and examined on June 09, 2012. PCP - patient follows at [facility]. Chief Complaint: Confusion. HPI: 78 year-old female with history of breast cancer status post mastectomy and has had history of radiation and chemotherapy, diabetes mellitus type 2, hypertension, hyperlipidemia was found to be confused when patient was talking to her brother over the phone. EMS was called and patient was brought to the ER and at that point patient was found to be more alert awake oriented and nonfocal. Patient unaware was found to be febrile and chest x-ray showed pneumonic features. CT head was negative and at this time patient has been admitted for further management. Patient states that over the last few days patient has been having subjective feeling of upper respiratory infection like symptoms with fever and chills. Denies any chest pain vomiting abdominal pain or diarrhea. Denies any focal deficits was of consciousness.  Past Medical History  Diagnosis Date  . Diabetes mellitus without complication   . Hypertension   . Arthritis   . Cancer     breast CA and skin CA     Past Surgical History  Procedure Date  . Abdominal hysterectomy   . Breast surgery 1988    bilateral  . Tonsillectomy     Family History  Problem Relation Age of Onset  . Stroke Mother   . Diabetes type II Sister   . Diabetes type II Brother    Social History:  reports that she has never smoked. She does not have any smokeless tobacco history on file. She reports that she does not drink alcohol or use illicit drugs.  Allergies: No Known Allergies   (Not in a hospital admission)  Results for orders placed during the hospital encounter of 06/08/12 (from the past 48 hour(s))  CBC     Status: Abnormal   Collection Time   06/08/12  9:14 PM      Component Value Range Comment   WBC 15.2 (*) 4.0 - 10.5 K/uL    RBC 4.11  3.87 - 5.11 MIL/uL    Hemoglobin 13.3  12.0 - 15.0 g/dL     HCT 45.4  09.8 - 11.9 %    MCV 95.1  78.0 - 100.0 fL    MCH 32.4  26.0 - 34.0 pg    MCHC 34.0  30.0 - 36.0 g/dL    RDW 14.7  82.9 - 56.2 %    Platelets 217  150 - 400 K/uL   COMPREHENSIVE METABOLIC PANEL     Status: Abnormal   Collection Time   06/08/12  9:14 PM      Component Value Range Comment   Sodium 135  135 - 145 mEq/L    Potassium 3.5  3.5 - 5.1 mEq/L    Chloride 98  96 - 112 mEq/L    CO2 22  19 - 32 mEq/L    Glucose, Bld 148 (*) 70 - 99 mg/dL    BUN 18  6 - 23 mg/dL    Creatinine, Ser 1.30 (*) 0.50 - 1.10 mg/dL    Calcium 9.5  8.4 - 86.5 mg/dL    Total Protein 7.8  6.0 - 8.3 g/dL    Albumin 3.5  3.5 - 5.2 g/dL    AST 17  0 - 37 U/L    ALT 16  0 - 35 U/L    Alkaline Phosphatase 54  39 - 117 U/L    Total Bilirubin  0.5  0.3 - 1.2 mg/dL    GFR calc non Af Amer 45 (*) >90 mL/min    GFR calc Af Amer 53 (*) >90 mL/min   URINALYSIS, ROUTINE W REFLEX MICROSCOPIC     Status: Abnormal   Collection Time   06/08/12  9:55 PM      Component Value Range Comment   Color, Urine YELLOW  YELLOW    APPearance CLEAR  CLEAR    Specific Gravity, Urine 1.019  1.005 - 1.030    pH 5.5  5.0 - 8.0    Glucose, UA NEGATIVE  NEGATIVE mg/dL    Hgb urine dipstick TRACE (*) NEGATIVE    Bilirubin Urine NEGATIVE  NEGATIVE    Ketones, ur 15 (*) NEGATIVE mg/dL    Protein, ur NEGATIVE  NEGATIVE mg/dL    Urobilinogen, UA 0.2  0.0 - 1.0 mg/dL    Nitrite NEGATIVE  NEGATIVE    Leukocytes, UA NEGATIVE  NEGATIVE   URINE MICROSCOPIC-ADD ON     Status: Abnormal   Collection Time   06/08/12  9:55 PM      Component Value Range Comment   Squamous Epithelial / LPF RARE  RARE    WBC, UA 0-2  <3 WBC/hpf    RBC / HPF 0-2  <3 RBC/hpf    Bacteria, UA RARE  RARE    Casts HYALINE CASTS (*) NEGATIVE    Urine-Other LESS THAN 10 mL OF URINE SUBMITTED     DIFFERENTIAL     Status: Abnormal   Collection Time   06/08/12 10:35 PM      Component Value Range Comment   Neutrophils Relative 74  43 - 77 %    Neutro Abs  9.4 (*) 1.7 - 7.7 K/uL    Lymphocytes Relative 14  12 - 46 %    Lymphs Abs 1.7  0.7 - 4.0 K/uL    Monocytes Relative 10  3 - 12 %    Monocytes Absolute 1.3 (*) 0.1 - 1.0 K/uL    Eosinophils Relative 2  0 - 5 %    Eosinophils Absolute 0.3  0.0 - 0.7 K/uL    Basophils Relative 0  0 - 1 %    Basophils Absolute 0.0  0.0 - 0.1 K/uL    Dg Chest 2 View  06/08/2012  *RADIOLOGY REPORT*  Clinical Data: Cough, altered mental status  CHEST - 2 VIEW  Comparison: None.  Findings: Patchy right apical opacity, possibly reflecting pneumonia.  If chronic, this appearance could also reflect pleural parenchymal scarring.  Underlying mass is considered less likely.  No pleural effusion or pneumothorax.  Cardiomediastinal silhouette is within normal limits.  Degenerative changes of the visualized thoracolumbar spine.  Surgical clips in the bilateral chest wall / axilla.  IMPRESSION: Patchy right apical opacity, possibly reflecting pneumonia. Pleural parenchymal scarring is also possible.  Follow-up chest radiographs are suggested to assess for resolution. If persistent, consider CT.   Original Report Authenticated By: Charline Bills, M.D.    Ct Head Wo Contrast  06/08/2012  *RADIOLOGY REPORT*  Clinical Data: Altered mental status.  CT HEAD WITHOUT CONTRAST  Technique:  Contiguous axial images were obtained from the base of the skull through the vertex without contrast.  Comparison: None.  Findings: There is no evidence of acute infarction, mass lesion, or intra- or extra-axial hemorrhage on CT.  Mild scattered subcortical white matter change likely reflects small vessel ischemic microangiopathy.  The posterior fossa, including the cerebellum, brainstem and fourth ventricle, is  within normal limits.  The third and lateral ventricles, and basal ganglia are unremarkable in appearance.  The cerebral hemispheres demonstrate grossly normal gray-white differentiation.  No mass effect or midline shift is seen.  There is no  evidence of fracture; hyperostosis frontalis interna is noted.  The orbits are within normal limits.  The paranasal sinuses and mastoid air cells are well-aerated.  No significant soft tissue abnormalities are seen.  IMPRESSION:  1.  No acute intracranial pathology seen on CT. 2.  Mild scattered small vessel ischemic microangiopathy noted.   Original Report Authenticated By: Tonia Ghent, M.D.     Review of Systems  Constitutional: Positive for fever.  HENT: Positive for congestion.   Eyes: Negative.   Respiratory: Negative.   Cardiovascular: Negative.   Gastrointestinal: Negative.   Genitourinary: Negative.   Musculoskeletal: Negative.   Skin: Negative.   Neurological:       Confusion.  Endo/Heme/Allergies: Negative.   Psychiatric/Behavioral: Negative.     Blood pressure 111/92, pulse 88, temperature 100.9 F (38.3 C), temperature source Rectal, resp. rate 19, SpO2 94.00%. Physical Exam  Constitutional: She is oriented to person, place, and time. She appears well-developed and well-nourished. No distress.  HENT:  Head: Normocephalic and atraumatic.  Right Ear: External ear normal.  Left Ear: External ear normal.  Nose: Nose normal.  Mouth/Throat: Oropharynx is clear and moist. No oropharyngeal exudate.  Eyes: Conjunctivae normal are normal. Pupils are equal, round, and reactive to light. Right eye exhibits no discharge. Left eye exhibits no discharge. No scleral icterus.  Neck: Normal range of motion. Neck supple.  Cardiovascular: Normal rate and regular rhythm.   Respiratory: Effort normal and breath sounds normal. No respiratory distress. She has no wheezes. She has no rales.  GI: Soft. Bowel sounds are normal. She exhibits no distension. There is no tenderness. There is no rebound.  Musculoskeletal: She exhibits no edema and no tenderness.  Neurological: She is alert and oriented to person, place, and time.       Moves all extremities.  Skin: Skin is warm and dry. She is not  diaphoretic.     Assessment/Plan #1. Pneumonia - we'll treat this as community-acquired pneumonia. Check influenza panel. #2. Acute encephalopathy - essentially has resolved. Probably from the febrile illness. Check MRI brain as initially there was a concern for possible stroke. #3. Hypertension - continue present medications. #4. Hyperlipidemia - continue present medications. #5. Diabetes mellitus type 2 - continue present medications. #6. History of breast cancer status post mastectomy bilaterally.  CODE STATUS - full code.  Eline Geng N. 06/09/2012, 1:05 AM

## 2012-06-09 NOTE — ED Provider Notes (Signed)
Medical screening examination/treatment/procedure(s) were conducted as a shared visit with non-physician practitioner(s) and myself.  I personally evaluated the patient during the encounter 78 yo woman had chills, fever and cough, some confusion when on telephone earlier this evening.  When I saw her she was mentally clear, neurologically intact.  Lab tests showed elevated WBC, and chest x-ray showed pneumonia.  Rx for CAP, doubt TIA.   Carleene Cooper III, MD 06/09/12 1230

## 2012-06-10 DIAGNOSIS — R4182 Altered mental status, unspecified: Secondary | ICD-10-CM

## 2012-06-10 LAB — GLUCOSE, CAPILLARY
Glucose-Capillary: 145 mg/dL — ABNORMAL HIGH (ref 70–99)
Glucose-Capillary: 200 mg/dL — ABNORMAL HIGH (ref 70–99)

## 2012-06-10 LAB — BASIC METABOLIC PANEL
BUN: 10 mg/dL (ref 6–23)
Chloride: 102 mEq/L (ref 96–112)
Creatinine, Ser: 0.97 mg/dL (ref 0.50–1.10)
GFR calc Af Amer: 63 mL/min — ABNORMAL LOW (ref >90)

## 2012-06-10 MED ORDER — LEVOFLOXACIN 500 MG PO TABS: 500.0000 mg | ORAL_TABLET | Freq: Every day | ORAL | Status: DC

## 2012-06-10 MED ORDER — HYDROCOD POLST-CHLORPHEN POLST 10-8 MG/5ML PO LQCR
5.0000 mL | Freq: Two times a day (BID) | ORAL | Status: DC | PRN
  Administered 2012-06-10: 5 mL via ORAL
  Filled 2012-06-10: qty 5

## 2012-06-10 MED ORDER — TORSEMIDE 10 MG PO TABS
10.0000 mg | ORAL_TABLET | Freq: Once | ORAL | Status: AC
  Administered 2012-06-10: 10 mg via ORAL
  Filled 2012-06-10 (×2): qty 1

## 2012-06-10 MED ORDER — LEVOFLOXACIN 500 MG PO TABS
500.0000 mg | ORAL_TABLET | Freq: Every day | ORAL | Status: DC
  Administered 2012-06-10: 500 mg via ORAL
  Filled 2012-06-10 (×2): qty 1

## 2012-06-10 MED ORDER — ALBUTEROL SULFATE (5 MG/ML) 0.5% IN NEBU
5.0000 mg | INHALATION_SOLUTION | Freq: Once | RESPIRATORY_TRACT | Status: AC
  Administered 2012-06-10: 5 mg via RESPIRATORY_TRACT
  Filled 2012-06-10: qty 0.5

## 2012-06-10 MED ORDER — PROPRANOLOL HCL ER 120 MG PO CP24
120.0000 mg | ORAL_CAPSULE | Freq: Every day | ORAL | Status: DC
  Administered 2012-06-10: 120 mg via ORAL
  Filled 2012-06-10 (×2): qty 1

## 2012-06-10 MED ORDER — METFORMIN HCL 500 MG PO TABS
1000.0000 mg | ORAL_TABLET | Freq: Two times a day (BID) | ORAL | Status: DC
  Administered 2012-06-10 – 2012-06-11 (×2): 1000 mg via ORAL
  Filled 2012-06-10 (×4): qty 2

## 2012-06-10 MED ORDER — MENTHOL 3 MG MT LOZG
1.0000 | LOZENGE | OROMUCOSAL | Status: DC | PRN
Start: 2012-06-10 — End: 2012-06-11
  Administered 2012-06-10: 3 mg via ORAL
  Filled 2012-06-10: qty 9

## 2012-06-10 MED ORDER — PROPRANOLOL HCL ER BEADS 120 MG PO CP24: 120.0000 mg | ORAL_CAPSULE | Freq: Every day | ORAL | Status: DC

## 2012-06-10 MED ORDER — IPRATROPIUM BROMIDE 0.02 % IN SOLN
0.5000 mg | Freq: Once | RESPIRATORY_TRACT | Status: AC
  Administered 2012-06-10: 0.5 mg via RESPIRATORY_TRACT
  Filled 2012-06-10: qty 2.5

## 2012-06-10 NOTE — Progress Notes (Signed)
Pt. With c/o SOB @ 2030.  Pt states "I'm wheezing."  Faint expiratory wheezing auscultated in upper lobes and fine crackles heard bilaterally lower lobes.  O2 sat 94% RA and RR 22.  Pt. Noted to have demadex on PTA med list.  Pt. denies hx of heart failure but states "I do take a fluid pill."  Night hospitalist notified.  Order received for duoneb and home dose demadex.  meds given per order.  Pt. No longer wheezing after duoneb and states "i don't feel as short of breath."  Will continue to monitor patient.

## 2012-06-10 NOTE — Discharge Summary (Addendum)
Physician Discharge Summary  Shelley Glover ZOX:[id number] DOB: 1932/02/08 DOA: 06/08/2012  PCP: Lanny Cramp, MD  Admit date: 06/08/2012 Discharge date: 06/11/2012  Time spent: 45 minutes  Recommendations for Outpatient Follow-up:  1. PCP in 2 weeks Check Bp.  Discharge Diagnoses:  Principal Problem:  *Encephalopathy acute Active Problems:  Community acquired pneumonia  History of breast cancer  HTN (hypertension)  Hyperlipidemia  Diabetes mellitus   Discharge Condition: stable  Diet recommendation: heart healthy diet  Filed Weights   06/09/12 0200 06/10/12 0627 06/11/12 0502  Weight: 83.825 kg (184 lb 12.8 oz) 85.276 kg (188 lb) 85.276 kg (188 lb)    History of present illness:  78 year-old female with history of breast cancer status post mastectomy and has had history of radiation and chemotherapy, diabetes mellitus type 2, hypertension, hyperlipidemia was found to be confused when patient was talking to her brother over the phone. EMS was called and patient was brought to the ER and at that point patient was found to be more alert awake oriented and nonfocal. Patient unaware was found to be febrile and chest x-ray showed pneumonic features. CT head was negative and at this time patient has been admitted for further management. Patient states that over the last few days patient has been having subjective feeling of upper respiratory infection like symptoms with fever and chills. Denies any chest pain vomiting abdominal pain or diarrhea. Denies any focal deficits was of consciousness.   Hospital Course:  Community acquired pneumonia (06/09/2012): - 06/08/2012 Rocephin and azithromycin changed to Levaquin on 12.25.2013.  - afebrile, leukocytosis improving.   Encephalopathy acute (06/09/2012) - resolved.  -This is most likely secondary to her community acquired pneumonia.  - CT scan of her head did not show any acute infarct. On physical exam she is nonfocal.   HTN  (hypertension) (06/09/2012) - resume home medications.   Hyperlipidemia (06/09/2012) - continue statins.   Diabetes mellitus (06/09/2012) - blood glucose running high start her on Lantus, continue sliding scale insulin.  -To be started on home med regimen as an outpatient.    Procedures:  none (i.e. Studies not automatically included, echos, thoracentesis, etc; not x-rays)  Consultations:  none  Discharge Exam: Filed Vitals:   06/10/12 1443 06/10/12 1953 06/10/12 2100 06/11/12 0502  BP: 120/73  148/67 129/58  Pulse: 77 95 91 73  Temp: 97.8 F (36.6 C)  99.3 F (37.4 C) 99.1 F (37.3 C)  TempSrc: Oral  Oral Oral  Resp: 18 20 20    Height:      Weight:    85.276 kg (188 lb)  SpO2: 96% 99% 96% 97%    General: A&o x3  Cardiovascular: RRR  Respiratory: Good air movement CTA B/L  Discharge Instructions      Discharge Orders    Future Orders Please Complete By Expires   Diet - low sodium heart healthy      Increase activity slowly          Medication List     As of 06/11/2012  7:35 AM    TAKE these medications         albuterol 108 (90 BASE) MCG/ACT inhaler   Commonly known as: PROVENTIL HFA;VENTOLIN HFA   Inhale 2 puffs into the lungs every 6 (six) hours as needed for wheezing.      aspirin EC 81 MG tablet   Take 81 mg by mouth daily.      atorvastatin 10 MG tablet   Commonly known as: LIPITOR  Take 10 mg by mouth daily.      chlorpheniramine-HYDROcodone 10-8 MG/5ML Lqcr   Commonly known as: TUSSIONEX   Take 5 mLs by mouth every 12 (twelve) hours as needed.      Fluticasone-Salmeterol 250-50 MCG/DOSE Aepb   Commonly known as: ADVAIR   Inhale 1 puff into the lungs every 12 (twelve) hours.      glipiZIDE 10 MG 24 hr tablet   Commonly known as: GLUCOTROL XL   Take 10 mg by mouth daily.      letrozole 2.5 MG tablet   Commonly known as: FEMARA   Take 2.5 mg by mouth daily.      levofloxacin 500 MG tablet   Commonly known as: LEVAQUIN   Take 1  tablet (500 mg total) by mouth daily.      losartan 100 MG tablet   Commonly known as: COZAAR   Take 100 mg by mouth daily.      metFORMIN 500 MG tablet   Commonly known as: GLUCOPHAGE   Take 1,000 mg by mouth 2 (two) times daily with a meal.      pantoprazole 40 MG tablet   Commonly known as: PROTONIX   Take 40 mg by mouth daily.      propranolol 120 MG 24 hr capsule   Commonly known as: INNOPRAN XL   Take 120 mg by mouth at bedtime.      torsemide 10 MG tablet   Commonly known as: DEMADEX   Take 10 mg by mouth daily.         Follow-up Information    Follow up with [facility], MD. In 3 weeks. (hospital follow up)    Contact information:   Shelley Glover [place] [phone]           The results of significant diagnostics from this hospitalization (including imaging, microbiology, ancillary and laboratory) are listed below for reference.    Significant Diagnostic Studies: Dg Chest 2 View  06/08/2012  *RADIOLOGY REPORT*  Clinical Data: Cough, altered mental status  CHEST - 2 VIEW  Comparison: None.  Findings: Patchy right apical opacity, possibly reflecting pneumonia.  If chronic, this appearance could also reflect pleural parenchymal scarring.  Underlying mass is considered less likely.  No pleural effusion or pneumothorax.  Cardiomediastinal silhouette is within normal limits.  Degenerative changes of the visualized thoracolumbar spine.  Surgical clips in the bilateral chest wall / axilla.  IMPRESSION: Patchy right apical opacity, possibly reflecting pneumonia. Pleural parenchymal scarring is also possible.  Follow-up chest radiographs are suggested to assess for resolution. If persistent, consider CT.   Original Report Authenticated By: Charline Bills, M.D.    Ct Head Wo Contrast  06/08/2012  *RADIOLOGY REPORT*  Clinical Data: Altered mental status.  CT HEAD WITHOUT CONTRAST  Technique:  Contiguous axial images were obtained from the base of the skull through the  vertex without contrast.  Comparison: None.  Findings: There is no evidence of acute infarction, mass lesion, or intra- or extra-axial hemorrhage on CT.  Mild scattered subcortical white matter change likely reflects small vessel ischemic microangiopathy.  The posterior fossa, including the cerebellum, brainstem and fourth ventricle, is within normal limits.  The third and lateral ventricles, and basal ganglia are unremarkable in appearance.  The cerebral hemispheres demonstrate grossly normal gray-white differentiation.  No mass effect or midline shift is seen.  There is no evidence of fracture; hyperostosis frontalis interna is noted.  The orbits are within normal limits.  The paranasal sinuses and mastoid  air cells are well-aerated.  No significant soft tissue abnormalities are seen.  IMPRESSION:  1.  No acute intracranial pathology seen on CT. 2.  Mild scattered small vessel ischemic microangiopathy noted.   Original Report Authenticated By: Tonia Ghent, M.D.     Microbiology: No results found for this or any previous visit (from the past 240 hour(s)).   Labs: Basic Metabolic Panel:  Lab 06/10/12 1478 06/09/12 0535 06/08/12 2114  NA 139 138 135  K 3.9 3.2* 3.5  CL 102 99 98  CO2 22 24 22   GLUCOSE 141* 150* 148*  BUN 10 13 18   CREATININE 0.97 0.95 1.13*  CALCIUM 9.2 9.0 9.5  MG -- -- --  PHOS -- -- --   Liver Function Tests:  Lab 06/09/12 0535 06/08/12 2114  AST 15 17  ALT 14 16  ALKPHOS 54 54  BILITOT 0.7 0.5  PROT 7.5 7.8  ALBUMIN 3.2* 3.5   No results found for this basename: LIPASE:5,AMYLASE:5 in the last 168 hours No results found for this basename: AMMONIA:5 in the last 168 hours CBC:  Lab 06/09/12 0535 06/08/12 2235 06/08/12 2114  WBC 12.1* -- 15.2*  NEUTROABS 9.0* 9.4* --  HGB 12.5 -- 13.3  HCT 35.7* -- 39.1  MCV 93.7 -- 95.1  PLT 210 -- 217   Cardiac Enzymes: No results found for this basename: CKTOTAL:5,CKMB:5,CKMBINDEX:5,TROPONINI:5 in the last 168  hours BNP: BNP (last 3 results) No results found for this basename: PROBNP:3 in the last 8760 hours CBG:  Lab 06/10/12 2106 06/10/12 1630 06/10/12 1133 06/10/12 0731 06/09/12 2237  GLUCAP 118* 200* 136* 145* 143*       Signed:  David Stall, ABRAHAM  [facility] 06/11/2012, 7:35 AM

## 2012-06-10 NOTE — Progress Notes (Signed)
[facility] PROGRESS NOTE  Assessment/Plan: Community acquired pneumonia (06/09/2012) - afebrile, leukocytosis improving.  - 06/08/2012 Rocephin and azithromycin changed to Levaquin on 12.25.2013.  Encephalopathy acute (06/09/2012) - resolved. -This is most likely secondary to her community acquired pneumonia. - CT scan of her head did not show any acute infarct. On physical exam she is nonfocal.  HTN (hypertension) (06/09/2012) - resume home medications.  Hyperlipidemia (06/09/2012) - continue statins.  Diabetes mellitus (06/09/2012) - blood glucose running high start her on Lantus, continue sliding scale insulin. -To be started on home med regimen as an outpatient.    Code Status: full Family Communication: son  Disposition Plan: Home am   Consultants:  none  Procedures:  none  Antibiotics: Azithromycin and Rocephin 06/09/2012.  HPI/Subjective: No complains  Objective: Filed Vitals:   06/09/12 1000 06/09/12 1338 06/09/12 2039 06/10/12 0627  BP: 166/70 145/67  150/62  Pulse: 86 92  81  Temp:  99.1 F (37.3 C)  99.9 F (37.7 C)  TempSrc:  Oral  Oral  Resp: 18 18    Height:      Weight:    85.276 kg (188 lb)  SpO2: 93% 97% 98% 93%    Intake/Output Summary (Last 24 hours) at 06/10/12 0833 Last data filed at 06/09/12 1900  Gross per 24 hour  Intake    480 ml  Output    500 ml  Net    -20 ml   Filed Weights   06/09/12 0200 06/10/12 0627  Weight: 83.825 kg (184 lb 12.8 oz) 85.276 kg (188 lb)    Exam:  General: Alert, awake, oriented x3, in no acute distress.  HEENT: No bruits, no goiter.  Heart: Regular rate and rhythm, without murmurs, rubs, gallops.  Lungs: Good air movement, bilateral rhonchi. Abdomen: Soft, nontender, nondistended, positive bowel sounds.  Neuro: Grossly intact, nonfocal. Facial droop old    Data Reviewed: Basic Metabolic Panel:  Lab 06/10/12 4782 06/09/12 0535 06/08/12 2114  NA 139 138 135  K 3.9 3.2* 3.5  CL  102 99 98  CO2 22 24 22   GLUCOSE 141* 150* 148*  BUN 10 13 18   CREATININE 0.97 0.95 1.13*  CALCIUM 9.2 9.0 9.5  MG -- -- --  PHOS -- -- --   Liver Function Tests:  Lab 06/09/12 0535 06/08/12 2114  AST 15 17  ALT 14 16  ALKPHOS 54 54  BILITOT 0.7 0.5  PROT 7.5 7.8  ALBUMIN 3.2* 3.5   No results found for this basename: LIPASE:5,AMYLASE:5 in the last 168 hours No results found for this basename: AMMONIA:5 in the last 168 hours CBC:  Lab 06/09/12 0535 06/08/12 2235 06/08/12 2114  WBC 12.1* -- 15.2*  NEUTROABS 9.0* 9.4* --  HGB 12.5 -- 13.3  HCT 35.7* -- 39.1  MCV 93.7 -- 95.1  PLT 210 -- 217   Cardiac Enzymes: No results found for this basename: CKTOTAL:5,CKMB:5,CKMBINDEX:5,TROPONINI:5 in the last 168 hours BNP (last 3 results) No results found for this basename: PROBNP:3 in the last 8760 hours CBG:  Lab 06/10/12 0731 06/09/12 2237 06/09/12 1642 06/09/12 1115 06/09/12 0750  GLUCAP 145* 143* 123* 179* 172*    No results found for this or any previous visit (from the past 240 hour(s)).   Studies: Dg Chest 2 View  06/08/2012  *RADIOLOGY REPORT*  Clinical Data: Cough, altered mental status  CHEST - 2 VIEW  Comparison: None.  Findings: Patchy right apical opacity, possibly reflecting pneumonia.  If chronic, this appearance could also  reflect pleural parenchymal scarring.  Underlying mass is considered less likely.  No pleural effusion or pneumothorax.  Cardiomediastinal silhouette is within normal limits.  Degenerative changes of the visualized thoracolumbar spine.  Surgical clips in the bilateral chest wall / axilla.  IMPRESSION: Patchy right apical opacity, possibly reflecting pneumonia. Pleural parenchymal scarring is also possible.  Follow-up chest radiographs are suggested to assess for resolution. If persistent, consider CT.   Original Report Authenticated By: Charline Bills, M.D.    Ct Head Wo Contrast  06/08/2012  *RADIOLOGY REPORT*  Clinical Data: Altered mental  status.  CT HEAD WITHOUT CONTRAST  Technique:  Contiguous axial images were obtained from the base of the skull through the vertex without contrast.  Comparison: None.  Findings: There is no evidence of acute infarction, mass lesion, or intra- or extra-axial hemorrhage on CT.  Mild scattered subcortical white matter change likely reflects small vessel ischemic microangiopathy.  The posterior fossa, including the cerebellum, brainstem and fourth ventricle, is within normal limits.  The third and lateral ventricles, and basal ganglia are unremarkable in appearance.  The cerebral hemispheres demonstrate grossly normal gray-white differentiation.  No mass effect or midline shift is seen.  There is no evidence of fracture; hyperostosis frontalis interna is noted.  The orbits are within normal limits.  The paranasal sinuses and mastoid air cells are well-aerated.  No significant soft tissue abnormalities are seen.  IMPRESSION:  1.  No acute intracranial pathology seen on CT. 2.  Mild scattered small vessel ischemic microangiopathy noted.   Original Report Authenticated By: Tonia Ghent, M.D.     Scheduled Meds:    . antiseptic oral rinse  15 mL Mouth Rinse BID  . aspirin EC  81 mg Oral Daily  . atorvastatin  10 mg Oral q1800  . enoxaparin (LOVENOX) injection  40 mg Subcutaneous Q24H  . glipiZIDE  10 mg Oral Q breakfast  . insulin aspart  0-9 Units Subcutaneous TID WC  . insulin glargine  20 Units Subcutaneous QHS  . letrozole  2.5 mg Oral Daily  . levofloxacin  500 mg Oral Daily  . losartan  100 mg Oral Daily  . mometasone-formoterol  2 puff Inhalation BID  . pantoprazole  40 mg Oral Daily  . propranolol ER  120 mg Oral QHS  . sodium chloride  3 mL Intravenous Q12H   Continuous Infusions:    . sodium chloride       Marinda Elk  [facility] Pager [phone]. If 8PM-8AM, please contact night-coverage at [email or web address], password [facility] 06/10/2012, 8:33 AM  LOS: 2 days

## 2012-06-11 LAB — GLUCOSE, CAPILLARY: Glucose-Capillary: 128 mg/dL — ABNORMAL HIGH (ref 70–99)

## 2012-06-11 MED ORDER — IPRATROPIUM BROMIDE 0.02 % IN SOLN: 0.5000 mg | RESPIRATORY_TRACT | Status: DC | PRN

## 2012-06-11 MED ORDER — HYDROCOD POLST-CHLORPHEN POLST 10-8 MG/5ML PO LQCR: 5.0000 mL | Freq: Two times a day (BID) | ORAL | Status: DC | PRN

## 2012-06-11 MED ORDER — ALBUTEROL SULFATE (5 MG/ML) 0.5% IN NEBU: 2.5000 mg | INHALATION_SOLUTION | RESPIRATORY_TRACT | Status: DC | PRN

## 2012-06-11 MED ORDER — TORSEMIDE 10 MG PO TABS
10.0000 mg | ORAL_TABLET | Freq: Once | ORAL | Status: DC
  Filled 2012-06-11: qty 1

## 2012-06-11 MED ORDER — ALBUTEROL SULFATE HFA 108 (90 BASE) MCG/ACT IN AERS: 2.0000 | INHALATION_SPRAY | Freq: Four times a day (QID) | RESPIRATORY_TRACT | Status: AC | PRN

## 2012-06-11 NOTE — Progress Notes (Signed)
Physical Therapy Treatment Patient Details Name: Shelley Glover MRN: [id number] DOB: 1932-06-01 Today's Date: 06/11/2012 Time: 0940-1003 PT Time Calculation (min): 23 min  PT Assessment / Plan / Recommendation Comments on Treatment Session  Pt is moving better however continues to show signs of SOB and fatigue.  Educated pt on slowly performing home activities and using SPC to assist for endurance especially long distances.  Pt with slight antalgic gait with ambulation.     Follow Up Recommendations  No PT follow up     Does the patient have the potential to tolerate intense rehabilitation     Barriers to Discharge        Equipment Recommendations  None recommended by PT    Recommendations for Other Services    Frequency Min 3X/week   Plan Discharge plan remains appropriate;Frequency remains appropriate    Precautions / Restrictions Precautions Precautions: Fall Restrictions Weight Bearing Restrictions: No   Pertinent Vitals/Pain No c/o pain    Mobility  Bed Mobility Bed Mobility: Not assessed Transfers Transfers: Sit to Stand;Stand to Sit Sit to Stand: 7: Independent;From bed Stand to Sit: 7: Independent;To bed Ambulation/Gait Ambulation/Gait Assistance: 5: Supervision Ambulation Distance (Feet): 200 Feet Assistive device: None Ambulation/Gait Assistance Details: Supervision for safety with noticeable decrease stride length on right side. Pt has SPC at home and educated on using cane for safety and long distance. Gait Pattern: Step-through pattern;Decreased stride length Stairs: Yes Stairs Assistance: 4: Min guard Stair Management Technique: One rail Right;Forwards Number of Stairs: 3  Wheelchair Mobility Wheelchair Mobility: No    Exercises     PT Diagnosis:    PT Problem List:   PT Treatment Interventions:     PT Goals Acute Rehab PT Goals PT Goal Formulation: With patient/family Time For Goal Achievement: 06/16/12 Potential to Achieve Goals: Good Pt  will go Sit to Stand: with modified independence PT Goal: Sit to Stand - Progress: Met Pt will go Stand to Sit: with modified independence PT Goal: Stand to Sit - Progress: Met Pt will Ambulate: >150 feet;with modified independence;with least restrictive assistive device PT Goal: Ambulate - Progress: Progressing toward goal Pt will Go Up / Down Stairs: 3-5 stairs;with modified independence;with least restrictive assistive device PT Goal: Up/Down Stairs - Progress: Progressing toward goal  Visit Information  Last PT Received On: 06/11/12 Assistance Needed: +1    Subjective Data  Subjective: I'm suppose to go home today. Patient Stated Goal: To be able to take care of my special needs daughter   Cognition  Overall Cognitive Status: Appears within functional limits for tasks assessed/performed Arousal/Alertness: Awake/alert Orientation Level: Appears intact for tasks assessed Behavior During Session: [facility] for tasks performed    Balance     End of Session PT - End of Session Equipment Utilized During Treatment: Gait belt Activity Tolerance: Patient tolerated treatment well Patient left: in chair;with call bell/phone within reach;with family/visitor present Nurse Communication: Mobility status   GP     Foster Sonnier 06/11/2012, 1:29 PM Jake Shark, PT DPT [phone]

## 2013-05-19 ENCOUNTER — Ambulatory Visit (INDEPENDENT_AMBULATORY_CARE_PROVIDER_SITE_OTHER): Payer: Medicare Other | Admitting: Podiatrist

## 2013-05-19 ENCOUNTER — Encounter: Payer: Self-pay | Admitting: Podiatrist

## 2013-05-19 VITALS — BP 123/52 | HR 64 | Resp 16

## 2013-05-19 DIAGNOSIS — M79609 Pain in unspecified limb: Secondary | ICD-10-CM

## 2013-05-19 DIAGNOSIS — B351 Tinea unguium: Secondary | ICD-10-CM

## 2013-05-19 NOTE — Progress Notes (Signed)
HPI:  Patient presents today for follow up of foot and nail care. Denies any new complaints today.  Objective:  Patients chart is reviewed.  Neurovascular status unchanged.  Patients nails are thickened, discolored, distrophic, friable and brittle with yellow-brown discoloration. Patient subjectively relates they are painful with shoes and with ambulation of bilateral feet.  Assessment:  Symptomatic onychomycosis  Plan:  Discussed treatment options and alternatives.  The symptomatic toenails were debrided through manual an mechanical means without complication.  Return appointment recommended at routine intervals of 3 months    Theone Bowell, DPM   

## 2013-05-19 NOTE — Patient Instructions (Signed)
Diabetes and Foot Care Diabetes may cause you to have problems because of poor blood supply (circulation) to your feet and legs. This may cause the skin on your feet to become thinner, break easier, and heal more slowly. Your skin may become dry, and the skin may peel and crack. You may also have nerve damage in your legs and feet causing decreased feeling in them. You may not notice minor injuries to your feet that could lead to infections or more serious problems. Taking care of your feet is one of the most important things you can do for yourself.  HOME CARE INSTRUCTIONS  Wear shoes at all times, even in the house. Do not go barefoot. Bare feet are easily injured.  Check your feet daily for blisters, cuts, and redness. If you cannot see the bottom of your feet, use a mirror or ask someone for help.  Wash your feet with warm water (do not use hot water) and mild soap. Then pat your feet and the areas between your toes until they are completely dry. Do not soak your feet as this can dry your skin.  Apply a moisturizing lotion or petroleum jelly (that does not contain alcohol and is unscented) to the skin on your feet and to dry, brittle toenails. Do not apply lotion between your toes.  Trim your toenails straight across. Do not dig under them or around the cuticle. File the edges of your nails with an emery board or nail file.  Do not cut corns or calluses or try to remove them with medicine.  Wear clean socks or stockings every day. Make sure they are not too tight. Do not wear knee-high stockings since they may decrease blood flow to your legs.  Wear shoes that fit properly and have enough cushioning. To break in new shoes, wear them for just a few hours a day. This prevents you from injuring your feet. Always look in your shoes before you put them on to be sure there are no objects inside.  Do not cross your legs. This may decrease the blood flow to your feet.  If you find a minor scrape,  cut, or break in the skin on your feet, keep it and the skin around it clean and dry. These areas may be cleansed with mild soap and water. Do not cleanse the area with peroxide, alcohol, or iodine.  When you remove an adhesive bandage, be sure not to damage the skin around it.  If you have a wound, look at it several times a day to make sure it is healing.  Do not use heating pads or hot water bottles. They may burn your skin. If you have lost feeling in your feet or legs, you may not know it is happening until it is too late.  Make sure your health care provider performs a complete foot exam at least annually or more often if you have foot problems. Report any cuts, sores, or bruises to your health care provider immediately. SEEK MEDICAL CARE IF:   You have an injury that is not healing.  You have cuts or breaks in the skin.  You have an ingrown nail.  You notice redness on your legs or feet.  You feel burning or tingling in your legs or feet.  You have pain or cramps in your legs and feet.  Your legs or feet are numb.  Your feet always feel cold. SEEK IMMEDIATE MEDICAL CARE IF:   There is increasing redness,   swelling, or pain in or around a wound.  There is a red line that goes up your leg.  Pus is coming from a wound.  You develop a fever or as directed by your health care provider.  You notice a bad smell coming from an ulcer or wound. Document Released: 05/31/2000 Document Revised: 02/03/2013 Document Reviewed: 11/10/2012 [employer] Patient Information 2014 [employer].  

## 2013-08-18 ENCOUNTER — Ambulatory Visit (INDEPENDENT_AMBULATORY_CARE_PROVIDER_SITE_OTHER): Payer: Medicare Other | Admitting: Podiatrist

## 2013-08-18 ENCOUNTER — Ambulatory Visit: Payer: Medicare Other | Admitting: Podiatrist

## 2013-08-18 VITALS — BP 132/62 | HR 62 | Resp 16

## 2013-08-18 DIAGNOSIS — M216X9 Other acquired deformities of unspecified foot: Secondary | ICD-10-CM

## 2013-08-18 DIAGNOSIS — Q828 Other specified congenital malformations of skin: Secondary | ICD-10-CM

## 2013-08-18 DIAGNOSIS — M79609 Pain in unspecified limb: Secondary | ICD-10-CM

## 2013-08-18 DIAGNOSIS — B351 Tinea unguium: Secondary | ICD-10-CM

## 2013-08-18 DIAGNOSIS — E119 Type 2 diabetes mellitus without complications: Secondary | ICD-10-CM

## 2013-08-18 NOTE — Patient Instructions (Signed)
Diabetes and Foot Care Diabetes may cause you to have problems because of poor blood supply (circulation) to your feet and legs. This may cause the skin on your feet to become thinner, break easier, and heal more slowly. Your skin may become dry, and the skin may peel and crack. You may also have nerve damage in your legs and feet causing decreased feeling in them. You may not notice minor injuries to your feet that could lead to infections or more serious problems. Taking care of your feet is one of the most important things you can do for yourself.  HOME CARE INSTRUCTIONS  Wear shoes at all times, even in the house. Do not go barefoot. Bare feet are easily injured.  Check your feet daily for blisters, cuts, and redness. If you cannot see the bottom of your feet, use a mirror or ask someone for help.  Wash your feet with warm water (do not use hot water) and mild soap. Then pat your feet and the areas between your toes until they are completely dry. Do not soak your feet as this can dry your skin.  Apply a moisturizing lotion or petroleum jelly (that does not contain alcohol and is unscented) to the skin on your feet and to dry, brittle toenails. Do not apply lotion between your toes.  Trim your toenails straight across. Do not dig under them or around the cuticle. File the edges of your nails with an emery board or nail file.  Do not cut corns or calluses or try to remove them with medicine.  Wear clean socks or stockings every day. Make sure they are not too tight. Do not wear knee-high stockings since they may decrease blood flow to your legs.  Wear shoes that fit properly and have enough cushioning. To break in new shoes, wear them for just a few hours a day. This prevents you from injuring your feet. Always look in your shoes before you put them on to be sure there are no objects inside.  Do not cross your legs. This may decrease the blood flow to your feet.  If you find a minor scrape,  cut, or break in the skin on your feet, keep it and the skin around it clean and dry. These areas may be cleansed with mild soap and water. Do not cleanse the area with peroxide, alcohol, or iodine.  When you remove an adhesive bandage, be sure not to damage the skin around it.  If you have a wound, look at it several times a day to make sure it is healing.  Do not use heating pads or hot water bottles. They may burn your skin. If you have lost feeling in your feet or legs, you may not know it is happening until it is too late.  Make sure your health care provider performs a complete foot exam at least annually or more often if you have foot problems. Report any cuts, sores, or bruises to your health care provider immediately. SEEK MEDICAL CARE IF:   You have an injury that is not healing.  You have cuts or breaks in the skin.  You have an ingrown nail.  You notice redness on your legs or feet.  You feel burning or tingling in your legs or feet.  You have pain or cramps in your legs and feet.  Your legs or feet are numb.  Your feet always feel cold. SEEK IMMEDIATE MEDICAL CARE IF:   There is increasing redness,   swelling, or pain in or around a wound.  There is a red line that goes up your leg.  Pus is coming from a wound.  You develop a fever or as directed by your health care provider.  You notice a bad smell coming from an ulcer or wound. Document Released: 05/31/2000 Document Revised: 02/03/2013 Document Reviewed: 11/10/2012 [employer] Patient Information 2014 [employer].  

## 2013-08-19 NOTE — Progress Notes (Signed)
HPI:  Patient presents today for follow up of foot and nail care. Denies any new complaints today.  Objective:  Patients chart is reviewed.  Vascular status reveals pedal pulses noted at 2 out of 4 dp and pt bilateral .  Neurological sensation is decreased to [employer] monofilament bilateral at 3/5 sites.  Patients nails are thickened, discolored, distrophic, friable and brittle with yellow-brown discoloration. Patient subjectively relates they are painful with shoes and with ambulation of bilateral feet. She also has prominent metatarsal heads with hyperkeratotic lesion sub-met one and 5 bilaterally.  Assessment:  Symptomatic onychomycosis, diabetes, prominent plantarflexed metatarsals, hyperkeratotic lesions x2 bilateral  Plan:  Discussed treatment options and alternatives.  The symptomatic toenails were debrided through manual an mechanical means without complication.  Debrided hyperkeratotic lesions without complication. Return appointment recommended at routine intervals of 3 months    Trudie Buckler, DPM

## 2013-09-02 ENCOUNTER — Emergency Department (INDEPENDENT_AMBULATORY_CARE_PROVIDER_SITE_OTHER): Payer: Medicare Other

## 2013-09-02 ENCOUNTER — Emergency Department (INDEPENDENT_AMBULATORY_CARE_PROVIDER_SITE_OTHER)
Admission: EM | Admit: 2013-09-02 | Discharge: 2013-09-02 | Disposition: A | Discharge status: Home / Self Care | Payer: Medicare Other | Source: Home / Self Care

## 2013-09-02 ENCOUNTER — Encounter (HOSPITAL_COMMUNITY): Payer: Self-pay | Admitting: Emergency Medicine

## 2013-09-02 DIAGNOSIS — IMO0002 Reserved for concepts with insufficient information to code with codable children: Secondary | ICD-10-CM

## 2013-09-02 DIAGNOSIS — J189 Pneumonia, unspecified organism: Secondary | ICD-10-CM

## 2013-09-02 DIAGNOSIS — J984 Other disorders of lung: Secondary | ICD-10-CM

## 2013-09-02 DIAGNOSIS — J9801 Acute bronchospasm: Secondary | ICD-10-CM

## 2013-09-02 DIAGNOSIS — L02411 Cutaneous abscess of right axilla: Secondary | ICD-10-CM

## 2013-09-02 DIAGNOSIS — J42 Unspecified chronic bronchitis: Secondary | ICD-10-CM

## 2013-09-02 MED ORDER — IPRATROPIUM-ALBUTEROL 0.5-2.5 (3) MG/3ML IN SOLN
3.0000 mL | Freq: Once | RESPIRATORY_TRACT | Status: AC
  Administered 2013-09-02: 3 mL via RESPIRATORY_TRACT

## 2013-09-02 MED ORDER — AZITHROMYCIN 250 MG PO TABS: ORAL_TABLET | ORAL | Status: DC

## 2013-09-02 MED ORDER — IPRATROPIUM-ALBUTEROL 0.5-2.5 (3) MG/3ML IN SOLN
RESPIRATORY_TRACT | Status: AC
  Filled 2013-09-02: qty 3

## 2013-09-02 NOTE — Discharge Instructions (Signed)
Abscess An abscess is an infected area that contains a collection of pus and debris.It can occur in almost any part of the body. An abscess is also known as a furuncle or boil. CAUSES  An abscess occurs when tissue gets infected. This can occur from blockage of oil or sweat glands, infection of hair follicles, or a minor injury to the skin. As the body tries to fight the infection, pus collects in the area and creates pressure under the skin. This pressure causes pain. People with weakened immune systems have difficulty fighting infections and get certain abscesses more often.  SYMPTOMS Usually an abscess develops on the skin and becomes a painful mass that is red, warm, and tender. If the abscess forms under the skin, you may feel a moveable soft area under the skin. Some abscesses break open (rupture) on their own, but most will continue to get worse without care. The infection can spread deeper into the body and eventually into the bloodstream, causing you to feel ill.  DIAGNOSIS  Your caregiver will take your medical history and perform a physical exam. A sample of fluid may also be taken from the abscess to determine what is causing your infection. TREATMENT  Your caregiver may prescribe antibiotic medicines to fight the infection. However, taking antibiotics alone usually does not cure an abscess. Your caregiver may need to make a small cut (incision) in the abscess to drain the pus. In some cases, gauze is packed into the abscess to reduce pain and to continue draining the area. HOME CARE INSTRUCTIONS   Only take over-the-counter or prescription medicines for pain, discomfort, or fever as directed by your caregiver.  If you were prescribed antibiotics, take them as directed. Finish them even if you start to feel better.  If gauze is used, follow your caregiver's directions for changing the gauze.  To avoid spreading the infection:  Keep your draining abscess covered with a  bandage.  Wash your hands well.  Do not share personal care items, towels, or whirlpools with others.  Avoid skin contact with others.  Keep your skin and clothes clean around the abscess.  Keep all follow-up appointments as directed by your caregiver. SEEK MEDICAL CARE IF:   You have increased pain, swelling, redness, fluid drainage, or bleeding.  You have muscle aches, chills, or a general ill feeling.  You have a fever. MAKE SURE YOU:   Understand these instructions.  Will watch your condition.  Will get help right away if you are not doing well or get worse. Document Released: 03/13/2005 Document Revised: 12/03/2011 Document Reviewed: 08/16/2011 [facility] Patient Information 2014 [employer].  Bronchitis Bronchitis is inflammation of the airways that extend from the windpipe into the lungs (bronchi). The inflammation often causes mucus to develop, which leads to a cough. If the inflammation becomes severe, it may cause shortness of breath. CAUSES  Bronchitis may be caused by:   Viral infections.   Bacteria.   Cigarette smoke.   Allergens, pollutants, and other irritants.  SIGNS AND SYMPTOMS  The most common symptom of bronchitis is a frequent cough that produces mucus. Other symptoms include:  Fever.   Body aches.   Chest congestion.   Chills.   Shortness of breath.   Sore throat.  DIAGNOSIS  Bronchitis is usually diagnosed through a medical history and physical exam. Tests, such as chest X-rays, are sometimes done to rule out other conditions.  TREATMENT  You may need to avoid contact with whatever caused the problem (  smoking, for example). Medicines are sometimes needed. These may include:  Antibiotics. These may be prescribed if the condition is caused by bacteria.  Cough suppressants. These may be prescribed for relief of cough symptoms.   Inhaled medicines. These may be prescribed to help open your airways and make it easier for  you to breathe.   Steroid medicines. These may be prescribed for those with recurrent (chronic) bronchitis. HOME CARE INSTRUCTIONS  Get plenty of rest.   Drink enough fluids to keep your urine clear or pale yellow (unless you have a medical condition that requires fluid restriction). Increasing fluids may help thin your secretions and will prevent dehydration.   Only take over-the-counter or prescription medicines as directed by your health care provider.  Only take antibiotics as directed. Make sure you finish them even if you start to feel better.  Avoid secondhand smoke, irritating chemicals, and strong fumes. These will make bronchitis worse. If you are a smoker, quit smoking. Consider using nicotine gum or skin patches to help control withdrawal symptoms. Quitting smoking will help your lungs heal faster.   Put a cool-mist humidifier in your bedroom at night to moisten the air. This may help loosen mucus. Change the water in the humidifier daily. You can also run the hot water in your shower and sit in the bathroom with the door closed for 5 10 minutes.   Follow up with your health care provider as directed.   Wash your hands frequently to avoid catching bronchitis again or spreading an infection to others.  SEEK MEDICAL CARE IF: Your symptoms do not improve after 1 week of treatment.  SEEK IMMEDIATE MEDICAL CARE IF:  Your fever increases.  You have chills.   You have chest pain.   You have worsening shortness of breath.   You have bloody sputum.  You faint.  You have lightheadedness.  You have a severe headache.   You vomit repeatedly. MAKE SURE YOU:   Understand these instructions.  Will watch your condition.  Will get help right away if you are not doing well or get worse. Document Released: 06/03/2005 Document Revised: 03/24/2013 Document Reviewed: 01/26/2013 [facility] Patient Information 2014 [place].  Bronchospasm, Adult A  bronchospasm is a spasm or tightening of the airways going into the lungs. During a bronchospasm breathing becomes more difficult because the airways get smaller. When this happens there can be coughing, a whistling sound when breathing (wheezing), and difficulty breathing. Bronchospasm is often associated with asthma, but not all patients who experience a bronchospasm have asthma. CAUSES  A bronchospasm is caused by inflammation or irritation of the airways. The inflammation or irritation may be triggered by:   Allergies (such as to animals, pollen, food, or mold). Allergens that cause bronchospasm may cause wheezing immediately after exposure or many hours later.   Infection. Viral infections are believed to be the most common cause of bronchospasm.   Exercise.   Irritants (such as pollution, cigarette smoke, strong odors, aerosol sprays, and paint fumes).   Weather changes. Winds increase molds and pollens in the air. Rain refreshes the air by washing irritants out. Cold air may cause inflammation.   Stress and emotional upset.  SIGNS AND SYMPTOMS   Wheezing.   Excessive nighttime coughing.   Frequent or severe coughing with a simple cold.   Chest tightness.   Shortness of breath.  DIAGNOSIS  Bronchospasm is usually diagnosed through a history and physical exam. Tests, such as chest X-rays, are sometimes done to  look for other conditions. TREATMENT   Inhaled medicines can be given to open up your airways and help you breathe. The medicines can be given using either an inhaler or a nebulizer machine.  Corticosteroid medicines may be given for severe bronchospasm, usually when it is associated with asthma. HOME CARE INSTRUCTIONS   Always have a plan prepared for seeking medical care. Know when to call your health care provider and local emergency services (911 in the [place] Know where you can access local emergency care.  Only take medicines as directed by your health  care provider.  If you were prescribed an inhaler or nebulizer machine, ask your health care provider to explain how to use it correctly. Always use a spacer with your inhaler if you were given one.  It is necessary to remain calm during an attack. Try to relax and breathe more slowly.  Control your home environment in the following ways:   Change your heating and air conditioning filter at least once a month.   Limit your use of fireplaces and wood stoves.  Do not smoke and do not allow smoking in your home.   Avoid exposure to perfumes and fragrances.   Get rid of pests (such as roaches and mice) and their droppings.   Throw away plants if you see mold on them.   Keep your house clean and dust free.   Replace carpet with wood, tile, or vinyl flooring. Carpet can trap dander and dust.   Use allergy-proof pillows, mattress covers, and box spring covers.   Wash bed sheets and blankets every week in hot water and dry them in a dryer.   Use blankets that are made of polyester or cotton.   Wash hands frequently. SEEK MEDICAL CARE IF:   You have muscle aches.   You have chest pain.   The sputum changes from clear or white to yellow, green, gray, or bloody.   The sputum you cough up gets thicker.   There are problems that may be related to the medicine you are given, such as a rash, itching, swelling, or trouble breathing.  SEEK IMMEDIATE MEDICAL CARE IF:   You have worsening wheezing and coughing even after taking your prescribed medicines.   You have increased difficulty breathing.   You develop severe chest pain. MAKE SURE YOU:   Understand these instructions.  Will watch your condition.  Will get help right away if you are not doing well or get worse. Document Released: 06/06/2003 Document Revised: 02/03/2013 Document Reviewed: 11/23/2012 [facility] Patient Information 2014 [place].

## 2013-09-02 NOTE — ED Provider Notes (Signed)
CSN: [id number]     Arrival date & time 09/02/13  1248 History   First MD Initiated Contact with Patient 09/02/13 1352     Chief Complaint  Patient presents with  . Abscess   (Consider location/radiation/quality/duration/timing/severity/associated sxs/prior Treatment) HPI Comments: 80 year old female is brought in by a friend with 2  separate complaints. The first concern is that of an infection of the right excellent. She developed erythema approximately 1/2 weeks ago that has increased over this period of time. The area is quite tender and painful. She saw her PCP 2 days ago and was treated with clindamycin. She has been taking the medicine for approximately 48 hours. She states that she believes the arm lesion is not getting better and possibly getting worse.  The second concern is that of a cough. The patient has a history of asthma. She has a prescription for albuterol and Advair but only uses the Advair once a day and not every day. She only uses the albuterol he just say at nighttime if needed.   Past Medical History  Diagnosis Date  . Diabetes mellitus without complication   . Hypertension   . Arthritis   . Cancer     breast CA and skin CA    Past Surgical History  Procedure Laterality Date  . Abdominal hysterectomy    . Breast surgery  1988    bilateral  . Tonsillectomy     Family History  Problem Relation Age of Onset  . Stroke Mother   . Diabetes type II Sister   . Diabetes type II Brother    History  Substance Use Topics  . Smoking status: Never Smoker   . Smokeless tobacco: Not on file  . Alcohol Use: No   OB History   Grav Para Term Preterm Abortions TAB SAB Ect Mult Living                 Review of Systems  Constitutional: Negative for fever and fatigue.  HENT: Negative for congestion, ear pain, postnasal drip, rhinorrhea and sore throat.   Respiratory: Positive for cough.   Cardiovascular: Negative for chest pain and palpitations.  Gastrointestinal:  Negative.   Genitourinary: Negative.   Musculoskeletal: Negative.   Skin: Negative for rash.       As per history of present illness    Allergies  Review of patient's allergies indicates no known allergies.  Home Medications   Current Outpatient Rx  Name  Route  Sig  Dispense  Refill  . CLINDAMYCIN HCL PO   Oral   Take by mouth.         Marland Kitchen albuterol (PROVENTIL HFA;VENTOLIN HFA) 108 (90 BASE) MCG/ACT inhaler   Inhalation   Inhale 2 puffs into the lungs every 6 (six) hours as needed for wheezing.   1 Inhaler   2   . aspirin EC 81 MG tablet   Oral   Take 81 mg by mouth daily.         Marland Kitchen atorvastatin (LIPITOR) 10 MG tablet   Oral   Take 10 mg by mouth daily.         Marland Kitchen azithromycin (ZITHROMAX Z-PAK) 250 MG tablet      2 tabs po  today, then 1 q d for 4 d   6 each   0   . chlorpheniramine-HYDROcodone (TUSSIONEX) 10-8 MG/5ML LQCR   Oral   Take 5 mLs by mouth every 12 (twelve) hours as needed.   1000 mL  0   . Fluticasone-Salmeterol (ADVAIR) 250-50 MCG/DOSE AEPB   Inhalation   Inhale 1 puff into the lungs every 12 (twelve) hours.         Marland Kitchen glipiZIDE (GLUCOTROL XL) 10 MG 24 hr tablet   Oral   Take 10 mg by mouth daily.         Marland Kitchen letrozole (FEMARA) 2.5 MG tablet   Oral   Take 2.5 mg by mouth daily.         Marland Kitchen losartan (COZAAR) 100 MG tablet   Oral   Take 100 mg by mouth daily.         . metFORMIN (GLUCOPHAGE) 500 MG tablet   Oral   Take 1,000 mg by mouth 2 (two) times daily with a meal.         . pantoprazole (PROTONIX) 40 MG tablet   Oral   Take 40 mg by mouth daily.         . propranolol (INNOPRAN XL) 120 MG 24 hr capsule   Oral   Take 120 mg by mouth at bedtime.         . torsemide (DEMADEX) 10 MG tablet   Oral   Take 10 mg by mouth daily.          BP 149/69  Pulse 72  Temp(Src) 98.4 F (36.9 C) (Oral)  Resp 16  SpO2 98% Physical Exam  Nursing note and vitals reviewed. Constitutional: She is oriented to person, place,  and time. She appears well-developed and well-nourished. No distress.  HENT:  Mouth/Throat: Oropharynx is clear and moist. No oropharyngeal exudate.  Eyes: Conjunctivae and EOM are normal.  Neck: Neck supple.  Cardiovascular: Normal rate and regular rhythm.   Pulmonary/Chest: Effort normal. She has wheezes. She has rales.  Very poor air movement. Poor chest expansion. Bilateral diffuse expiratory and inspiratory wheezes. Basilar crackles.  Musculoskeletal: She exhibits edema.  Mild edema to the right upper extremity.  Lymphadenopathy:    She has no cervical adenopathy.  Neurological: She is alert and oriented to person, place, and time.  Skin: Skin is warm and dry.  Deep erythema to the R axilla, primarily to the lateral chest wall. Within the fossa lies a small fluctuant area, thinning overlying skin and extends approximately 5 cm. The remaining involved dermis is thick and hardened from previous surgery.   Psychiatric: She has a normal mood and affect.    ED Course  INCISION AND DRAINAGE Date/Time: 09/02/2013 3:20 PM Performed by: Marcha Dutton, Kymberley Raz Authorized by: Philipp Deputy C Consent: Verbal consent obtained. Risks and benefits: risks, benefits and alternatives were discussed Consent given by: patient Patient understanding: patient states understanding of the procedure being performed Patient identity confirmed: verbally with patient Type: abscess Body area: upper extremity Location details: right arm Local anesthetic: topical anesthetic Patient sedated: no Scalpel size: 11 Incision type: single straight Complexity: simple Drainage: purulent and  bloody Drainage amount: moderate Wound treatment: wound left open   (including critical care time) Labs Review Labs Reviewed  CULTURE, ROUTINE-ABSCESS   Imaging Review Dg Chest 2 View  09/02/2013   CLINICAL DATA:  Cough, shortness of breath  EXAM: CHEST  2 VIEW  COMPARISON:  DG CHEST 2 VIEW dated 06/08/2012  FINDINGS: There are  bilateral chronic bronchitic changes. There is no focal parenchymal opacity, pleural effusion, or pneumothorax. The heart and mediastinal contours are unremarkable.  There are bilateral axillary surgical clips.  The osseous structures are unremarkable.  IMPRESSION: Bilateral chronic bronchitic changes. Mild  superimposed atypical infection or acute bronchitis is not excluded.   Electronically Signed   By: Kathreen Devoid   On: 09/02/2013 15:06     MDM   1. Abscess of axilla, right   2. Chronic bronchitis   3. Pulmonary infection   4. Bronchospasm     Duoneb: post duoneb breathing better and less cough; improved air movement decreased wheeze. Due to possible superimposed pulmonary infection over chronic bronchitis tx with z-pack. Use albuterol HFA 2 puffs q 4h prn Advair bid Continue clindamycin.  Wound culture pending. I and D abscess. F/U with PCP next week  CXR    Janne Napoleon, NP 09/02/13 1544

## 2013-09-02 NOTE — ED Notes (Signed)
Pt  Has  Redness  And  Swelling  Under      r  Arm             X  10  Days          Placed      On  Anti  Biotics     2  Days  Ago  When [place]  [place]  [place]       The  Area  Appears  To  Be  Getting  according to pt         Pt  Also  Reports  A  persistant  Prolonged       Cough     Pt  Has  A  History  Of  Cancer     In past

## 2013-09-02 NOTE — ED Provider Notes (Signed)
Medical screening examination/treatment/procedure(s) were performed by non-physician practitioner and as supervising physician I was immediately available for consultation/collaboration.  Kimara Bencomo, M.D.  Joeli Fenner C Lynx Goodrich, MD 09/02/13 2300 

## 2013-09-06 LAB — CULTURE, ROUTINE-ABSCESS

## 2013-11-08 ENCOUNTER — Emergency Department (HOSPITAL_COMMUNITY): Payer: Medicare Other

## 2013-11-08 ENCOUNTER — Inpatient Hospital Stay (HOSPITAL_COMMUNITY): Payer: Medicare Other

## 2013-11-08 ENCOUNTER — Encounter (HOSPITAL_COMMUNITY): Payer: Self-pay | Admitting: Emergency Medicine

## 2013-11-08 ENCOUNTER — Inpatient Hospital Stay (HOSPITAL_COMMUNITY)
Admission: EM | Admit: 2013-11-08 | Discharge: 2013-12-15 | DRG: 870 | Disposition: E | Payer: Medicare Other | Attending: Pulmonary Disease | Admitting: Pulmonary Disease

## 2013-11-08 DIAGNOSIS — J690 Pneumonitis due to inhalation of food and vomit: Secondary | ICD-10-CM | POA: Diagnosis present

## 2013-11-08 DIAGNOSIS — IMO0002 Reserved for concepts with insufficient information to code with codable children: Secondary | ICD-10-CM | POA: Diagnosis present

## 2013-11-08 DIAGNOSIS — E871 Hypo-osmolality and hyponatremia: Secondary | ICD-10-CM | POA: Diagnosis present

## 2013-11-08 DIAGNOSIS — R4182 Altered mental status, unspecified: Secondary | ICD-10-CM

## 2013-11-08 DIAGNOSIS — R4020 Unspecified coma: Secondary | ICD-10-CM

## 2013-11-08 DIAGNOSIS — E870 Hyperosmolality and hypernatremia: Secondary | ICD-10-CM | POA: Diagnosis not present

## 2013-11-08 DIAGNOSIS — A419 Sepsis, unspecified organism: Principal | ICD-10-CM

## 2013-11-08 DIAGNOSIS — Z833 Family history of diabetes mellitus: Secondary | ICD-10-CM

## 2013-11-08 DIAGNOSIS — Z515 Encounter for palliative care: Secondary | ICD-10-CM

## 2013-11-08 DIAGNOSIS — D638 Anemia in other chronic diseases classified elsewhere: Secondary | ICD-10-CM | POA: Diagnosis present

## 2013-11-08 DIAGNOSIS — R680 Hypothermia, not associated with low environmental temperature: Secondary | ICD-10-CM | POA: Diagnosis present

## 2013-11-08 DIAGNOSIS — E1169 Type 2 diabetes mellitus with other specified complication: Secondary | ICD-10-CM | POA: Diagnosis present

## 2013-11-08 DIAGNOSIS — E119 Type 2 diabetes mellitus without complications: Secondary | ICD-10-CM

## 2013-11-08 DIAGNOSIS — I10 Essential (primary) hypertension: Secondary | ICD-10-CM

## 2013-11-08 DIAGNOSIS — J189 Pneumonia, unspecified organism: Secondary | ICD-10-CM

## 2013-11-08 DIAGNOSIS — J4489 Other specified chronic obstructive pulmonary disease: Secondary | ICD-10-CM | POA: Diagnosis present

## 2013-11-08 DIAGNOSIS — Z66 Do not resuscitate: Secondary | ICD-10-CM | POA: Diagnosis not present

## 2013-11-08 DIAGNOSIS — I959 Hypotension, unspecified: Secondary | ICD-10-CM | POA: Diagnosis not present

## 2013-11-08 DIAGNOSIS — Z853 Personal history of malignant neoplasm of breast: Secondary | ICD-10-CM

## 2013-11-08 DIAGNOSIS — E876 Hypokalemia: Secondary | ICD-10-CM | POA: Diagnosis not present

## 2013-11-08 DIAGNOSIS — J9600 Acute respiratory failure, unspecified whether with hypoxia or hypercapnia: Secondary | ICD-10-CM

## 2013-11-08 DIAGNOSIS — Z79899 Other long term (current) drug therapy: Secondary | ICD-10-CM

## 2013-11-08 DIAGNOSIS — J449 Chronic obstructive pulmonary disease, unspecified: Secondary | ICD-10-CM | POA: Diagnosis present

## 2013-11-08 DIAGNOSIS — Z7982 Long term (current) use of aspirin: Secondary | ICD-10-CM

## 2013-11-08 DIAGNOSIS — N179 Acute kidney failure, unspecified: Secondary | ICD-10-CM | POA: Diagnosis present

## 2013-11-08 DIAGNOSIS — G9340 Encephalopathy, unspecified: Secondary | ICD-10-CM

## 2013-11-08 DIAGNOSIS — R404 Transient alteration of awareness: Secondary | ICD-10-CM

## 2013-11-08 DIAGNOSIS — Z85828 Personal history of other malignant neoplasm of skin: Secondary | ICD-10-CM

## 2013-11-08 DIAGNOSIS — R403 Persistent vegetative state: Secondary | ICD-10-CM | POA: Diagnosis present

## 2013-11-08 DIAGNOSIS — K219 Gastro-esophageal reflux disease without esophagitis: Secondary | ICD-10-CM | POA: Diagnosis present

## 2013-11-08 DIAGNOSIS — M129 Arthropathy, unspecified: Secondary | ICD-10-CM | POA: Diagnosis present

## 2013-11-08 DIAGNOSIS — J9690 Respiratory failure, unspecified, unspecified whether with hypoxia or hypercapnia: Secondary | ICD-10-CM

## 2013-11-08 DIAGNOSIS — C50919 Malignant neoplasm of unspecified site of unspecified female breast: Secondary | ICD-10-CM | POA: Diagnosis present

## 2013-11-08 DIAGNOSIS — E861 Hypovolemia: Secondary | ICD-10-CM | POA: Diagnosis not present

## 2013-11-08 DIAGNOSIS — R6520 Severe sepsis without septic shock: Secondary | ICD-10-CM

## 2013-11-08 DIAGNOSIS — G40109 Localization-related (focal) (partial) symptomatic epilepsy and epileptic syndromes with simple partial seizures, not intractable, without status epilepticus: Secondary | ICD-10-CM | POA: Diagnosis present

## 2013-11-08 LAB — CBC
HEMATOCRIT: 32.9 % — AB (ref 36.0–46.0)
HEMOGLOBIN: 11 g/dL — AB (ref 12.0–15.0)
MCH: 31.8 pg (ref 26.0–34.0)
MCHC: 33.4 g/dL (ref 30.0–36.0)
MCV: 95.1 fL (ref 78.0–100.0)
Platelets: 289 10*3/uL (ref 150–400)
RBC: 3.46 MIL/uL — ABNORMAL LOW (ref 3.87–5.11)
RDW: 15.1 % (ref 11.5–15.5)
WBC: 27.3 10*3/uL — ABNORMAL HIGH (ref 4.0–10.5)

## 2013-11-08 LAB — CBC WITH DIFFERENTIAL/PLATELET
Basophils Absolute: 0 10*3/uL (ref 0.0–0.1)
Basophils Relative: 0 % (ref 0–1)
EOS ABS: 0 10*3/uL (ref 0.0–0.7)
Eosinophils Relative: 0 % (ref 0–5)
HCT: 31.1 % — ABNORMAL LOW (ref 36.0–46.0)
Hemoglobin: 10.5 g/dL — ABNORMAL LOW (ref 12.0–15.0)
Lymphocytes Relative: 3 % — ABNORMAL LOW (ref 12–46)
Lymphs Abs: 0.8 10*3/uL (ref 0.7–4.0)
MCH: 31.4 pg (ref 26.0–34.0)
MCHC: 33.8 g/dL (ref 30.0–36.0)
MCV: 93.1 fL (ref 78.0–100.0)
MONO ABS: 1.6 10*3/uL — AB (ref 0.1–1.0)
Monocytes Relative: 6 % (ref 3–12)
NEUTROS ABS: 23.8 10*3/uL — AB (ref 1.7–7.7)
NEUTROS PCT: 91 % — AB (ref 43–77)
Platelets: 316 10*3/uL (ref 150–400)
RBC: 3.34 MIL/uL — ABNORMAL LOW (ref 3.87–5.11)
RDW: 14.9 % (ref 11.5–15.5)
WBC: 26.2 10*3/uL — ABNORMAL HIGH (ref 4.0–10.5)

## 2013-11-08 LAB — GLUCOSE, CAPILLARY
GLUCOSE-CAPILLARY: 66 mg/dL — AB (ref 70–99)
Glucose-Capillary: 83 mg/dL (ref 70–99)
Glucose-Capillary: 168 mg/dL — ABNORMAL HIGH (ref 70–99)
Glucose-Capillary: 138 mg/dL — ABNORMAL HIGH (ref 70–99)

## 2013-11-08 LAB — PROTIME-INR
INR: 1.22 (ref 0.00–1.49)
Prothrombin Time: 15.1 seconds (ref 11.6–15.2)

## 2013-11-08 LAB — URINALYSIS, ROUTINE W REFLEX MICROSCOPIC
Bilirubin Urine: NEGATIVE
GLUCOSE, UA: NEGATIVE mg/dL
HGB URINE DIPSTICK: NEGATIVE
KETONES UR: NEGATIVE mg/dL
Leukocytes, UA: NEGATIVE
Nitrite: NEGATIVE
PH: 5 (ref 5.0–8.0)
Protein, ur: NEGATIVE mg/dL
Specific Gravity, Urine: 1.021 (ref 1.005–1.030)
Urobilinogen, UA: 0.2 mg/dL (ref 0.0–1.0)

## 2013-11-08 LAB — COMPREHENSIVE METABOLIC PANEL
ALT: 11 U/L (ref 0–35)
AST: 21 U/L (ref 0–37)
Albumin: 2.5 g/dL — ABNORMAL LOW (ref 3.5–5.2)
Alkaline Phosphatase: 86 U/L (ref 39–117)
BUN: 39 mg/dL — AB (ref 6–23)
CALCIUM: 7.8 mg/dL — AB (ref 8.4–10.5)
CO2: 18 mEq/L — ABNORMAL LOW (ref 19–32)
Chloride: 99 mEq/L (ref 96–112)
Creatinine, Ser: 2.14 mg/dL — ABNORMAL HIGH (ref 0.50–1.10)
GFR calc non Af Amer: 21 mL/min — ABNORMAL LOW (ref >90)
GFR, EST AFRICAN AMERICAN: 24 mL/min — AB (ref >90)
Glucose, Bld: 85 mg/dL (ref 70–99)
Potassium: 3.8 mEq/L (ref 3.7–5.3)
SODIUM: 133 meq/L — AB (ref 137–147)
TOTAL PROTEIN: 6.9 g/dL (ref 6.0–8.3)
Total Bilirubin: 0.4 mg/dL (ref 0.3–1.2)

## 2013-11-08 LAB — BASIC METABOLIC PANEL
BUN: 34 mg/dL — AB (ref 6–23)
CHLORIDE: 98 meq/L (ref 96–112)
CO2: 19 mEq/L (ref 19–32)
Calcium: 7.5 mg/dL — ABNORMAL LOW (ref 8.4–10.5)
Creatinine, Ser: 1.85 mg/dL — ABNORMAL HIGH (ref 0.50–1.10)
GFR calc Af Amer: 29 mL/min — ABNORMAL LOW (ref >90)
GFR calc non Af Amer: 25 mL/min — ABNORMAL LOW (ref >90)
GLUCOSE: 64 mg/dL — AB (ref 70–99)
POTASSIUM: 3.5 meq/L — AB (ref 3.7–5.3)
Sodium: 134 mEq/L — ABNORMAL LOW (ref 137–147)

## 2013-11-08 LAB — TROPONIN I: Troponin I: <0.3 ng/mL (ref <0.30)

## 2013-11-08 LAB — I-STAT ARTERIAL BLOOD GAS, ED
Acid-base deficit: 5 mmol/L — ABNORMAL HIGH (ref 0.0–2.0)
BICARBONATE: 21.8 meq/L (ref 20.0–24.0)
O2 Saturation: 100 %
PCO2 ART: 44.9 mmHg (ref 35.0–45.0)
PH ART: 7.285 — AB (ref 7.350–7.450)
PO2 ART: 490 mmHg — AB (ref 80.0–100.0)
Patient temperature: 95.5
TCO2: 23 mmol/L (ref 0–100)

## 2013-11-08 LAB — URINE CULTURE
Colony Count: NO GROWTH
Culture: NO GROWTH

## 2013-11-08 LAB — CREATININE, URINE, RANDOM: Creatinine, Urine: 77.47 mg/dL

## 2013-11-08 LAB — PROCALCITONIN: Procalcitonin: 0.99 ng/mL

## 2013-11-08 LAB — SODIUM, URINE, RANDOM: Sodium, Ur: <20 mEq/L

## 2013-11-08 LAB — MRSA PCR SCREENING: MRSA BY PCR: NEGATIVE

## 2013-11-08 LAB — CBG MONITORING, ED: Glucose-Capillary: 135 mg/dL — ABNORMAL HIGH (ref 70–99)

## 2013-11-08 LAB — TRIGLYCERIDES: Triglycerides: 106 mg/dL (ref <150)

## 2013-11-08 LAB — CREATININE, SERUM
Creatinine, Ser: 2.03 mg/dL — ABNORMAL HIGH (ref 0.50–1.10)
GFR calc Af Amer: 25 mL/min — ABNORMAL LOW (ref >90)
GFR calc non Af Amer: 22 mL/min — ABNORMAL LOW (ref >90)

## 2013-11-08 LAB — LACTIC ACID, PLASMA: LACTIC ACID, VENOUS: 1.6 mmol/L (ref 0.5–2.2)

## 2013-11-08 MED ORDER — VANCOMYCIN HCL 10 G IV SOLR
1250.0000 mg | Freq: Once | INTRAVENOUS | Status: AC
  Administered 2013-11-08: 1250 mg via INTRAVENOUS
  Filled 2013-11-08 (×2): qty 1250

## 2013-11-08 MED ORDER — CHLORHEXIDINE GLUCONATE 0.12 % MT SOLN
15.0000 mL | Freq: Two times a day (BID) | OROMUCOSAL | Status: DC
  Administered 2013-11-08 – 2013-11-14 (×12): 15 mL via OROMUCOSAL
  Filled 2013-11-08 (×12): qty 15

## 2013-11-08 MED ORDER — PANTOPRAZOLE SODIUM 40 MG IV SOLR
40.0000 mg | Freq: Every day | INTRAVENOUS | Status: DC
  Administered 2013-11-08: 40 mg via INTRAVENOUS
  Filled 2013-11-08 (×2): qty 40

## 2013-11-08 MED ORDER — ACETAMINOPHEN 160 MG/5ML PO SOLN
650.0000 mg | Freq: Four times a day (QID) | ORAL | Status: DC | PRN
  Administered 2013-11-08 – 2013-11-11 (×7): 650 mg
  Filled 2013-11-08 (×7): qty 20.3

## 2013-11-08 MED ORDER — SODIUM CHLORIDE 0.9 % IV BOLUS (SEPSIS)
500.0000 mL | Freq: Once | INTRAVENOUS | Status: AC
  Administered 2013-11-08: 500 mL via INTRAVENOUS

## 2013-11-08 MED ORDER — DEXTROSE 50 % IV SOLN
INTRAVENOUS | Status: AC
  Administered 2013-11-08: 50 mL via INTRAVENOUS
  Filled 2013-11-08: qty 50

## 2013-11-08 MED ORDER — PROPOFOL 10 MG/ML IV EMUL
INTRAVENOUS | Status: AC
  Administered 2013-11-08: 5 ug/kg/min via INTRAVENOUS
  Filled 2013-11-08: qty 100

## 2013-11-08 MED ORDER — ALBUTEROL SULFATE (2.5 MG/3ML) 0.083% IN NEBU: 2.5000 mg | INHALATION_SOLUTION | RESPIRATORY_TRACT | Status: DC | PRN

## 2013-11-08 MED ORDER — MIDAZOLAM HCL 2 MG/2ML IJ SOLN: 1.0000 mg | INTRAMUSCULAR | Status: DC | PRN

## 2013-11-08 MED ORDER — DEXTROSE 10 % IV SOLN
INTRAVENOUS | Status: DC
  Administered 2013-11-08 – 2013-11-09 (×2): via INTRAVENOUS

## 2013-11-08 MED ORDER — DEXTROSE 5 % IV SOLN
500.0000 mg | Freq: Once | INTRAVENOUS | Status: AC
  Administered 2013-11-08: 500 mg via INTRAVENOUS

## 2013-11-08 MED ORDER — PROPOFOL 10 MG/ML IV EMUL
5.0000 ug/kg/min | INTRAVENOUS | Status: DC
  Administered 2013-11-08 (×2): 5 ug/kg/min via INTRAVENOUS

## 2013-11-08 MED ORDER — VANCOMYCIN HCL IN DEXTROSE 750-5 MG/150ML-% IV SOLN
750.0000 mg | INTRAVENOUS | Status: DC
  Administered 2013-11-09: 750 mg via INTRAVENOUS
  Filled 2013-11-08 (×2): qty 150

## 2013-11-08 MED ORDER — FENTANYL CITRATE 0.05 MG/ML IJ SOLN
50.0000 ug | INTRAMUSCULAR | Status: DC | PRN
  Administered 2013-11-08 – 2013-11-10 (×2): 50 ug via INTRAVENOUS
  Filled 2013-11-08 (×2): qty 2

## 2013-11-08 MED ORDER — SODIUM CHLORIDE 0.9 % IV SOLN
1.5000 g | Freq: Two times a day (BID) | INTRAVENOUS | Status: DC
  Administered 2013-11-08 – 2013-11-14 (×12): 1.5 g via INTRAVENOUS
  Filled 2013-11-08 (×14): qty 1.5

## 2013-11-08 MED ORDER — DEXTROSE 50 % IV SOLN
50.0000 mL | Freq: Once | INTRAVENOUS | Status: AC | PRN
  Administered 2013-11-08: 50 mL via INTRAVENOUS

## 2013-11-08 MED ORDER — DEXTROSE 5 % IV SOLN
1.0000 g | Freq: Once | INTRAVENOUS | Status: AC
  Administered 2013-11-08: 1 g via INTRAVENOUS
  Filled 2013-11-08: qty 10

## 2013-11-08 MED ORDER — BIOTENE DRY MOUTH MT LIQD
15.0000 mL | Freq: Two times a day (BID) | OROMUCOSAL | Status: DC
  Administered 2013-11-09 – 2013-11-13 (×9): 15 mL via OROMUCOSAL

## 2013-11-08 MED ORDER — PROPOFOL 10 MG/ML IV EMUL
0.0000 ug/kg/min | INTRAVENOUS | Status: DC
  Administered 2013-11-08: 20 ug/kg/min via INTRAVENOUS
  Administered 2013-11-08: 30 ug/kg/min via INTRAVENOUS
  Administered 2013-11-09: 35 ug/kg/min via INTRAVENOUS
  Administered 2013-11-09 (×2): 30 ug/kg/min via INTRAVENOUS
  Administered 2013-11-10: 20 ug/kg/min via INTRAVENOUS
  Administered 2013-11-10: 35 ug/kg/min via INTRAVENOUS
  Administered 2013-11-10: 30 ug/kg/min via INTRAVENOUS
  Administered 2013-11-11: 35 ug/kg/min via INTRAVENOUS
  Filled 2013-11-08 (×9): qty 100

## 2013-11-08 MED ORDER — SODIUM CHLORIDE 0.9 % IJ SOLN: 10.0000 mL | INTRAMUSCULAR | Status: DC | PRN

## 2013-11-08 MED ORDER — DEXTROSE 50 % IV SOLN
INTRAVENOUS | Status: AC
  Administered 2013-11-08: 50 mL
  Filled 2013-11-08: qty 50

## 2013-11-08 MED ORDER — SODIUM CHLORIDE 0.9 % IJ SOLN
10.0000 mL | Freq: Two times a day (BID) | INTRAMUSCULAR | Status: DC
  Administered 2013-11-08 – 2013-11-11 (×6): 10 mL
  Administered 2013-11-12: 20 mL
  Administered 2013-11-12: 10 mL
  Administered 2013-11-13: 20 mL
  Administered 2013-11-13: 10 mL
  Administered 2013-11-14: 20 mL

## 2013-11-08 MED ORDER — HEPARIN SODIUM (PORCINE) 5000 UNIT/ML IJ SOLN
5000.0000 [IU] | Freq: Three times a day (TID) | INTRAMUSCULAR | Status: DC
  Administered 2013-11-08 – 2013-11-14 (×18): 5000 [IU] via SUBCUTANEOUS
  Filled 2013-11-08 (×20): qty 1

## 2013-11-08 MED ORDER — SODIUM CHLORIDE 0.9 % IV SOLN: 250.0000 mL | INTRAVENOUS | Status: DC | PRN

## 2013-11-08 MED ORDER — SODIUM CHLORIDE 0.9 % IV SOLN
INTRAVENOUS | Status: AC
  Administered 2013-11-08: 09:00:00 via INTRAVENOUS

## 2013-11-08 NOTE — Plan of Care (Signed)
Problem: Phase I Progression Outcomes Goal: VTE prophylaxis Outcome: Completed/Met Date Met:  11/10/2013 SCDs on

## 2013-11-08 NOTE — ED Notes (Signed)
Patient transported to CT with RN and RT and brought back to Trauma B

## 2013-11-08 NOTE — Progress Notes (Signed)
Hypoglycemic Event  CBG:66  Treatment: D50 IV 50 mL  Symptoms: None  Follow-up CBG: Time:1858 CBG Result:168  Possible Reasons for Event: Unknown  Comments/MD notified:Dr. Lurline Del  Remember to initiate Hypoglycemia Order Set & complete

## 2013-11-08 NOTE — Progress Notes (Signed)
Notified Elink of temp 102.8, and decreased urine output (35cc total for last 2 hours).  New orders received.  Cooling blanket applied to pt.  Will continue to monitor.  Vista Lawman, RN

## 2013-11-08 NOTE — ED Notes (Signed)
Patient tolerating vent well, care giver at bedside, awaiting furthur orders and PCCM to see for admit.

## 2013-11-08 NOTE — Progress Notes (Signed)
Pt transported on vent to unit accompanied by RT, report given to RT on 2S.

## 2013-11-08 NOTE — Consult Note (Addendum)
Referring Physician: Dr. Elsworth Soho   Chief Complaint: unresponsiveness HPI:                                                                                                                                         Shelley Glover is an 80 y.o. female with a past medical history significant for HTN, DM, breast cancer, and arthritis, brought in by EMS after being unresponsive at home. She reportedly had an accidental fall at home yesterday, but her caregiver said that she was otherwise normal last night. This morning, her caregiver found pt unresponsive. Pt with agonal resps, hypothermic, hypoglycemic (takes glipizide) and required intubation. CT brain showed no acute abnormality. At this time she is sedated with propofol, intubated, but displaying rhythmic, intermittent, subtle twitching of the left foot and thus a neurology consultation for possible focal seizures was requested.  Patient has no history of seizures and seems to be able to function well at home  Date last known well: 11/07/13 Time last known well: uncertain tPA Given: no, out of the window   Past Medical History  Diagnosis Date  . Diabetes mellitus without complication   . Hypertension   . Arthritis   . Cancer     breast CA and skin CA     Past Surgical History  Procedure Laterality Date  . Abdominal hysterectomy    . Breast surgery  1988    bilateral  . Tonsillectomy      Family History  Problem Relation Age of Onset  . Stroke Mother   . Diabetes type II Sister   . Diabetes type II Brother    Social History:  reports that she has never smoked. She does not have any smokeless tobacco history on file. She reports that she does not drink alcohol or use illicit drugs.  Allergies: No Known Allergies  Medications:                                                                                                                           Scheduled: . ampicillin-sulbactam (UNASYN) IV  1.5 g Intravenous Q12H  . heparin  5,000  Units Subcutaneous 3 times per day  . pantoprazole (PROTONIX) IV  40 mg Intravenous QHS  . vancomycin  1,250 mg Intravenous Once  . [START ON 11/09/2013] vancomycin  750 mg Intravenous Q24H    ROS: unable to  obtain due to mental status                                                                                                                      History obtained from chart review  Physical exam: sedated with propofol. Blood pressure 138/45, pulse 88, temperature 100.4 F (38 C), temperature source Rectal, resp. rate 23, height $RemoveBe'5\' 5"'ahoyvpSoB$  (1.651 m), weight 85.276 kg (188 lb), SpO2 100.00%. Head: normocephalic. Neck: supple, no bruits, no JVD. Cardiac: no murmurs. Lungs: clear. Abdomen: soft, no tender, no mass. Extremities: no edema. Neurologic Examination:                                                                                                      Mental status: patient intubated on propofol CN 2-12: pupils 3-4 mm bilaterally, reactive. No gaze preference. EOM preserved. No nystagmus. Tongue: intubated Motor: on propofol. Sensory: mild reaction to painful stimuli but on propofol DTR's: unable to elicit, on propofol. Plantars: mute. Coordination and gait: unable to test. No meningeal signs.  Results for orders placed during the hospital encounter of 11/07/2013 (from the past 48 hour(s))  CBG MONITORING, ED     Status: Abnormal   Collection Time    11/04/2013  8:57 AM      Result Value Ref Range   Glucose-Capillary 135 (*) 70 - 99 mg/dL  CBC WITH DIFFERENTIAL     Status: Abnormal   Collection Time    11/11/2013  8:59 AM      Result Value Ref Range   WBC 26.2 (*) 4.0 - 10.5 K/uL   Comment: REPEATED TO VERIFY   RBC 3.34 (*) 3.87 - 5.11 MIL/uL   Hemoglobin 10.5 (*) 12.0 - 15.0 g/dL   Comment: REPEATED TO VERIFY   HCT 31.1 (*) 36.0 - 46.0 %   MCV 93.1  78.0 - 100.0 fL   MCH 31.4  26.0 - 34.0 pg   MCHC 33.8  30.0 - 36.0 g/dL   RDW 14.9  11.5 - 15.5 %   Platelets 316  150 -  400 K/uL   Neutrophils Relative % 91 (*) 43 - 77 %   Lymphocytes Relative 3 (*) 12 - 46 %   Monocytes Relative 6  3 - 12 %   Eosinophils Relative 0  0 - 5 %   Basophils Relative 0  0 - 1 %   Neutro Abs 23.8 (*) 1.7 - 7.7 K/uL   Lymphs Abs 0.8  0.7 - 4.0 K/uL   Monocytes Absolute 1.6 (*) 0.1 - 1.0 K/uL   Eosinophils  Absolute 0.0  0.0 - 0.7 K/uL   Basophils Absolute 0.0  0.0 - 0.1 K/uL   RBC Morphology POLYCHROMASIA PRESENT    COMPREHENSIVE METABOLIC PANEL     Status: Abnormal   Collection Time    11/07/2013  8:59 AM      Result Value Ref Range   Sodium 133 (*) 137 - 147 mEq/L   Potassium 3.8  3.7 - 5.3 mEq/L   Chloride 99  96 - 112 mEq/L   CO2 18 (*) 19 - 32 mEq/L   Glucose, Bld 85  70 - 99 mg/dL   BUN 39 (*) 6 - 23 mg/dL   Creatinine, Ser 2.14 (*) 0.50 - 1.10 mg/dL   Calcium 7.8 (*) 8.4 - 10.5 mg/dL   Total Protein 6.9  6.0 - 8.3 g/dL   Albumin 2.5 (*) 3.5 - 5.2 g/dL   AST 21  0 - 37 U/L   ALT 11  0 - 35 U/L   Alkaline Phosphatase 86  39 - 117 U/L   Total Bilirubin 0.4  0.3 - 1.2 mg/dL   GFR calc non Af Amer 21 (*) >90 mL/min   GFR calc Af Amer 24 (*) >90 mL/min   Comment: (NOTE)     The eGFR has been calculated using the CKD EPI equation.     This calculation has not been validated in all clinical situations.     eGFR's persistently <90 mL/min signify possible Chronic Kidney     Disease.  LACTIC ACID, PLASMA     Status: None   Collection Time    11/11/2013  8:59 AM      Result Value Ref Range   Lactic Acid, Venous 1.6  0.5 - 2.2 mmol/L  PROTIME-INR     Status: None   Collection Time    11/01/2013  8:59 AM      Result Value Ref Range   Prothrombin Time 15.1  11.6 - 15.2 seconds   INR 1.22  0.00 - 1.49  I-STAT ARTERIAL BLOOD GAS, ED     Status: Abnormal   Collection Time    10/26/2013  9:19 AM      Result Value Ref Range   pH, Arterial 7.285 (*) 7.350 - 7.450   pCO2 arterial 44.9  35.0 - 45.0 mmHg   pO2, Arterial 490.0 (*) 80.0 - 100.0 mmHg   Bicarbonate 21.8  20.0 -  24.0 mEq/L   TCO2 23  0 - 100 mmol/L   O2 Saturation 100.0     Acid-base deficit 5.0 (*) 0.0 - 2.0 mmol/L   Patient temperature 95.5 F     Collection site RADIAL, ALLEN'S TEST ACCEPTABLE     Drawn by Operator     Sample type ARTERIAL    URINALYSIS, ROUTINE W REFLEX MICROSCOPIC     Status: Abnormal   Collection Time    11/06/2013 11:55 AM      Result Value Ref Range   Color, Urine YELLOW  YELLOW   APPearance CLOUDY (*) CLEAR   Specific Gravity, Urine 1.021  1.005 - 1.030   pH 5.0  5.0 - 8.0   Glucose, UA NEGATIVE  NEGATIVE mg/dL   Hgb urine dipstick NEGATIVE  NEGATIVE   Bilirubin Urine NEGATIVE  NEGATIVE   Ketones, ur NEGATIVE  NEGATIVE mg/dL   Protein, ur NEGATIVE  NEGATIVE mg/dL   Urobilinogen, UA 0.2  0.0 - 1.0 mg/dL   Nitrite NEGATIVE  NEGATIVE   Leukocytes, UA NEGATIVE  NEGATIVE   Comment: MICROSCOPIC  NOT DONE ON URINES WITH NEGATIVE PROTEIN, BLOOD, LEUKOCYTES, NITRITE, OR GLUCOSE <1000 mg/dL.   Ct Head Wo Contrast  10/29/2013   CLINICAL DATA:  Unresponsive, fell, dilated pupils.  EXAM: CT HEAD WITHOUT CONTRAST  CT CERVICAL SPINE WITHOUT CONTRAST  TECHNIQUE: Multidetector CT imaging of the head and cervical spine was performed following the standard protocol without intravenous contrast. Multiplanar CT image reconstructions of the cervical spine were also generated.  COMPARISON:  06/08/2012  FINDINGS: CT HEAD FINDINGS  Mucoperiosteal thickening in ethmoid air cells.  There is no evidence of acute intracranial hemorrhage, brain edema, mass lesion, acute infarction, mass effect, or midline shift. Acute infarct may be inapparent on noncontrast CT. No other intra-axial abnormalities are seen, and the ventricles and sulci are within normal limits in size and symmetry. No abnormal extra-axial fluid collections or masses are identified. No significant calvarial abnormality.  CT CERVICAL SPINE FINDINGS  Normal alignment. Mild narrowing of C5-6 and C6-7 interspaces with endplate spurring. Facet  DJD bilaterally C2-3, C3-4, C4-5. No prevertebral soft tissue swelling. Negative for fracture. Asymmetric pleural-based irregular opacity at the in the right lung apex. Endotracheal tube partially seen.  IMPRESSION: 1. Negative for bleed or other acute intracranial abnormality. 2. Negative cervical fracture or other acute bone abnormality. 3. Multilevel cervical degenerative changes as above. 4. Pleural-based right apical lung opacity, incompletely characterized.   Electronically Signed   By: Arne Cleveland M.D.   On: 10/29/2013 10:21   Ct Cervical Spine Wo Contrast  10/19/2013   CLINICAL DATA:  Unresponsive, fell, dilated pupils.  EXAM: CT HEAD WITHOUT CONTRAST  CT CERVICAL SPINE WITHOUT CONTRAST  TECHNIQUE: Multidetector CT imaging of the head and cervical spine was performed following the standard protocol without intravenous contrast. Multiplanar CT image reconstructions of the cervical spine were also generated.  COMPARISON:  06/08/2012  FINDINGS: CT HEAD FINDINGS  Mucoperiosteal thickening in ethmoid air cells.  There is no evidence of acute intracranial hemorrhage, brain edema, mass lesion, acute infarction, mass effect, or midline shift. Acute infarct may be inapparent on noncontrast CT. No other intra-axial abnormalities are seen, and the ventricles and sulci are within normal limits in size and symmetry. No abnormal extra-axial fluid collections or masses are identified. No significant calvarial abnormality.  CT CERVICAL SPINE FINDINGS  Normal alignment. Mild narrowing of C5-6 and C6-7 interspaces with endplate spurring. Facet DJD bilaterally C2-3, C3-4, C4-5. No prevertebral soft tissue swelling. Negative for fracture. Asymmetric pleural-based irregular opacity at the in the right lung apex. Endotracheal tube partially seen.  IMPRESSION: 1. Negative for bleed or other acute intracranial abnormality. 2. Negative cervical fracture or other acute bone abnormality. 3. Multilevel cervical degenerative  changes as above. 4. Pleural-based right apical lung opacity, incompletely characterized.   Electronically Signed   By: Arne Cleveland M.D.   On: 10/26/2013 10:21   Dg Chest Portable 1 View  11/14/2013   CLINICAL DATA:  ALTERED MENTAL STATUS ALTERED MENTAL STATUS, intubated  EXAM: PORTABLE CHEST - 1 VIEW  COMPARISON:  09/02/2013  FINDINGS: Endotracheal tube tip is approximately 4 cm above carina. Nasogastric tube extends at least as far as the stomach, tip not seen. Surgical clips in bilateral axilla. Mild cardiomegaly. Relatively low lung volumes with coarse predominantly perihilar interstitial opacities. . No effusion. Visualized skeletal structures are unremarkable.  IMPRESSION: 1. Endotracheal tube and nasogastric tube placement as above. 2. Coarse perihilar infiltrates or edema.   Electronically Signed   By: Arne Cleveland M.D.   On: 11/07/2013 10:35  Assessment: 80 y.o. female with acute unresponsiveness of unclear etiology, and rhythmic, repetitive movements left foot. Concern of left focal motor seizure, and thus agree with empirically loading patient with 1 gram IV keppra now. Elevated white count >25,000 with 91% PMN. Cr 2.14, thus toxic metabolic encephalopathy, stroke, seizures in the differential.  EEG. MRI when clinically stable. Will follow up.   Dorian Pod, MD [facility] [phone]  10/20/2013, 2:19 PM

## 2013-11-08 NOTE — ED Notes (Signed)
Asked dr Audie Pinto on arrival when he drew blood regarding blood cultures and stated none were needed. PCCM ordered blood cultures 1 hour post administration of abx. Saa aware ands ts no access to get cultrures from at this time,

## 2013-11-08 NOTE — Progress Notes (Signed)
Utilization Review Completed.Shelley Kelliher T Dowell5/25/2015  

## 2013-11-08 NOTE — Progress Notes (Signed)
Unit CM UR Completed by [facility] ED CM  W. Denyla Cortese RN  

## 2013-11-08 NOTE — Progress Notes (Signed)
ANTIBIOTIC CONSULT NOTE - INITIAL  Pharmacy Consult for Vancomycin and Unasyn Indication: pneumonia and  cellulitis  No Known Allergies  Patient Measurements: Height: 5\' 5"  (165.1 cm) Weight: 188 lb (85.276 kg) IBW/kg (Calculated) : 57 Adjusted Body Weight:   Vital Signs: Temp: 100.4 F (38 C) (05/25 1315) Temp src: Rectal (05/25 0901) BP: 138/45 mmHg (05/25 1315) Pulse Rate: 88 (05/25 1315) Intake/Output from previous day:   Intake/Output from this shift:    Labs:  Recent Labs  11/07/2013 0859  WBC 26.2*  HGB 10.5*  PLT 316  CREATININE 2.14*   Estimated Creatinine Clearance: 22.6 ml/min (by C-G formula based on Cr of 2.14). No results found for this basename: VANCOTROUGH, VANCOPEAK, VANCORANDOM, GENTTROUGH, GENTPEAK, GENTRANDOM, TOBRATROUGH, TOBRAPEAK, TOBRARND, AMIKACINPEAK, AMIKACINTROU, AMIKACIN,  in the last 72 hours   Microbiology: No results found for this or any previous visit (from the past 720 hour(s)).  Medical History: Past Medical History  Diagnosis Date  . Diabetes mellitus without complication   . Hypertension   . Arthritis   . Cancer     breast CA and skin CA     Medications:  Scheduled:  . heparin  5,000 Units Subcutaneous 3 times per day  . pantoprazole (PROTONIX) IV  40 mg Intravenous QHS   Assessment: 80yo female with AMS, respiratory failure as well as aspiration pna and cellulitis.  She has received Rocephin and Azithromycin IV x 1 in ED, to start Vanc and Unasyn.  Calculated CrCl ~50ml/min, WBC 26.2  Goal of Therapy:  Vancomycin trough level 15-20 mcg/ml  Plan:  1-  Unasyn 1.5gm IV q12 2-  Vancomcyin 1250mg  IV x 1, then 750mg  IV q24 3-  Follow renal fxn, culture results 4-  SS Vanc trough when appropriate  Gracy Bruins, PharmD [facility]

## 2013-11-08 NOTE — Progress Notes (Signed)
Notified Dr. Nelda Marseille of SBP dropping from  170's to 77/30 after 1 dose fentanyl 50 mcg and propofol at 30 mcg/kg/min. Also notified of temp 102.7 core (foley). Orders received. Verified Central line placement ok to use.

## 2013-11-08 NOTE — H&P (Signed)
PULMONARY / CRITICAL CARE MEDICINE   Name: Janica Eldred MRN: [id number] DOB: Aug 24, 1931    ADMISSION DATE:  10/17/2013  REFERRING MD :  EDP PRIMARY SERVICE: PCCM  CHIEF COMPLAINT:  AMS, resp failure   BRIEF PATIENT DESCRIPTION: 80 yo female with hx DM, HTN, hx breast cancer with significant radiation wounds to R axilla with wound vac.  Had accidental  fall at home 5/24, no LOC, seen by EMS but refused ER.  On 5/25 caregiver found pt unresponsive.  Pt with agonal resps, hypothermic, hypoglycemic (takes glipizide), dilated pupils, clenched jaw, nasally intubated by EMS.  PCCM called to admit.   SIGNIFICANT EVENTS / STUDIES:  CT head 5/25>>>  LINES / TUBES: ETT 5/25 (nasal) >>>  CULTURES: BCx2 5/25>>> Urine 5/25>>> Sputum 5/25>>>  ANTIBIOTICS: Vanc 5/25>>> unasyn 5/25>>>  HISTORY OF PRESENT ILLNESS:  80 yo female with hx DM, HTN, hx breast cancer with significant radiation wounds to R axilla with wound vac.  Had mechanical fall at home 5/24, no LOC, seen by EMS but refused ER.  On 5/25 caregiver found pt unresponsive.  Pt with agonal resps, hypothermic, hypoglycemic, dilated pupils, clenched jaw, nasally intubated by EMS.  No hx seizure.  Prior to fall yesterday was in usual state of health. No fevers, chills, SOB, chest pain, n/v/d, headache. PCCM called to admit.  Spoke to care giver, CBG 140 on 5/24, PO intake ok, no headache or prior seizure   PAST MEDICAL HISTORY :  Past Medical History  Diagnosis Date  . Diabetes mellitus without complication   . Hypertension   . Arthritis   . Cancer     breast CA and skin CA    Past Surgical History  Procedure Laterality Date  . Abdominal hysterectomy    . Breast surgery  1988    bilateral  . Tonsillectomy     Prior to Admission medications   Medication Sig Start Date End Date Taking? Authorizing Provider  albuterol (PROVENTIL HFA;VENTOLIN HFA) 108 (90 BASE) MCG/ACT inhaler Inhale 2 puffs into the lungs every 6 (six) hours as  needed for wheezing. 06/11/12  Yes Charlynne Cousins, MD  aspirin EC 81 MG tablet Take 81 mg by mouth daily.   Yes Historical Provider, MD  atorvastatin (LIPITOR) 10 MG tablet Take 10 mg by mouth daily.   Yes Historical Provider, MD  calcium-vitamin D (OSCAL WITH D) 500-200 MG-UNIT per tablet Take 1 tablet by mouth 2 (two) times daily.   Yes Historical Provider, MD  collagenase (SANTYL) ointment Apply 1 application topically 3 (three) times a week. Three times weekly for wound vac change.   Yes Historical Provider, MD  fluticasone (FLONASE) 50 MCG/ACT nasal spray Place 2 sprays into both nostrils daily.   Yes Historical Provider, MD  Fluticasone-Salmeterol (ADVAIR) 250-50 MCG/DOSE AEPB Inhale 1 puff into the lungs every 12 (twelve) hours.   Yes Historical Provider, MD  glipiZIDE (GLUCOTROL XL) 10 MG 24 hr tablet Take 10 mg by mouth daily.   Yes Historical Provider, MD  letrozole (FEMARA) 2.5 MG tablet Take 2.5 mg by mouth daily.   Yes Historical Provider, MD  losartan (COZAAR) 100 MG tablet Take 100 mg by mouth daily.   Yes Historical Provider, MD  metFORMIN (GLUCOPHAGE) 1000 MG tablet Take 1,000 mg by mouth daily with breakfast.   Yes Historical Provider, MD  pantoprazole (PROTONIX) 40 MG tablet Take 40 mg by mouth daily.   Yes Historical Provider, MD  propranolol (INNOPRAN XL) 120 MG 24 hr capsule Take  120 mg by mouth at bedtime.   Yes Historical Provider, MD  torsemide (DEMADEX) 10 MG tablet Take 10 mg by mouth daily.   Yes Historical Provider, MD   No Known Allergies  FAMILY HISTORY:  Family History  Problem Relation Age of Onset  . Stroke Mother   . Diabetes type II Sister   . Diabetes type II Brother    SOCIAL HISTORY:  reports that she has never smoked. She does not have any smokeless tobacco history on file. She reports that she does not drink alcohol or use illicit drugs.  REVIEW OF SYSTEMS:  As per HPI - obtained from caregiver.   SUBJECTIVE:   VITAL SIGNS: Temp:  [95.5 F  (35.3 C)] 95.5 F (35.3 C) (05/25 0901) Pulse Rate:  [75-82] 82 (05/25 1245) Resp:  [18-26] 22 (05/25 1245) BP: (114-156)/(55-131) 147/75 mmHg (05/25 1245) SpO2:  [100 %] 100 % (05/25 1245) FiO2 (%):  [0.3 %-0.4 %] 0.3 % (05/25 1150) HEMODYNAMICS:   VENTILATOR SETTINGS: Vent Mode:  [-] PRVC FiO2 (%):  [0.3 %-0.4 %] 0.3 % Set Rate:  [14 bmp] 14 bmp Vt Set:  [460 mL] 460 mL PEEP:  [5 cmH20] 5 cmH20 Plateau Pressure:  [16 cmH20] 16 cmH20 INTAKE / OUTPUT: Intake/Output   None     PHYSICAL EXAMINATION: General:  Chronically ill appearing female, NAD  Neuro:  Minimally responsive, jerking movements BLE, pupils 39mm, reactive , no neck stiffness HEENT:  Mm moist, no JVD, nasal ETT Cardiovascular:  s1s2 rrr Lungs:  resps even non labored on vent, diminished R  Abdomen:  Soft, +bs Musculoskeletal:  Warm and dry, R axilla large wound with VAC, surrounding skin cellulitic, firm to touch, no BLE edema   LABS:  CBC  Recent Labs Lab 10/27/2013 0859  WBC 26.2*  HGB 10.5*  HCT 31.1*  PLT 316   Coag's  Recent Labs Lab 11/07/2013 0859  INR 1.22   BMET  Recent Labs Lab 10/23/2013 0859  NA 133*  K 3.8  CL 99  CO2 18*  BUN 39*  CREATININE 2.14*  GLUCOSE 85   Electrolytes  Recent Labs Lab 10/19/2013 0859  CALCIUM 7.8*   Sepsis Markers  Recent Labs Lab 11/12/2013 0859  LATICACIDVEN 1.6   ABG  Recent Labs Lab 10/16/2013 0919  PHART 7.285*  PCO2ART 44.9  PO2ART 490.0*   Liver Enzymes  Recent Labs Lab 10/22/2013 0859  AST 21  ALT 11  ALKPHOS 86  BILITOT 0.4  ALBUMIN 2.5*   Cardiac Enzymes No results found for this basename: TROPONINI, PROBNP,  in the last 168 hours Glucose  Recent Labs Lab 11/11/2013 0857  GLUCAP 135*    Imaging Ct Head Wo Contrast  11/04/2013   CLINICAL DATA:  Unresponsive, fell, dilated pupils.  EXAM: CT HEAD WITHOUT CONTRAST  CT CERVICAL SPINE WITHOUT CONTRAST  TECHNIQUE: Multidetector CT imaging of the head and cervical spine was  performed following the standard protocol without intravenous contrast. Multiplanar CT image reconstructions of the cervical spine were also generated.  COMPARISON:  06/08/2012  FINDINGS: CT HEAD FINDINGS  Mucoperiosteal thickening in ethmoid air cells.  There is no evidence of acute intracranial hemorrhage, brain edema, mass lesion, acute infarction, mass effect, or midline shift. Acute infarct may be inapparent on noncontrast CT. No other intra-axial abnormalities are seen, and the ventricles and sulci are within normal limits in size and symmetry. No abnormal extra-axial fluid collections or masses are identified. No significant calvarial abnormality.  CT CERVICAL SPINE FINDINGS  Normal alignment. Mild narrowing of C5-6 and C6-7 interspaces with endplate spurring. Facet DJD bilaterally C2-3, C3-4, C4-5. No prevertebral soft tissue swelling. Negative for fracture. Asymmetric pleural-based irregular opacity at the in the right lung apex. Endotracheal tube partially seen.  IMPRESSION: 1. Negative for bleed or other acute intracranial abnormality. 2. Negative cervical fracture or other acute bone abnormality. 3. Multilevel cervical degenerative changes as above. 4. Pleural-based right apical lung opacity, incompletely characterized.   Electronically Signed   By: Arne Cleveland M.D.   On: 10/23/2013 10:21   Ct Cervical Spine Wo Contrast  11/10/2013   CLINICAL DATA:  Unresponsive, fell, dilated pupils.  EXAM: CT HEAD WITHOUT CONTRAST  CT CERVICAL SPINE WITHOUT CONTRAST  TECHNIQUE: Multidetector CT imaging of the head and cervical spine was performed following the standard protocol without intravenous contrast. Multiplanar CT image reconstructions of the cervical spine were also generated.  COMPARISON:  06/08/2012  FINDINGS: CT HEAD FINDINGS  Mucoperiosteal thickening in ethmoid air cells.  There is no evidence of acute intracranial hemorrhage, brain edema, mass lesion, acute infarction, mass effect, or midline  shift. Acute infarct may be inapparent on noncontrast CT. No other intra-axial abnormalities are seen, and the ventricles and sulci are within normal limits in size and symmetry. No abnormal extra-axial fluid collections or masses are identified. No significant calvarial abnormality.  CT CERVICAL SPINE FINDINGS  Normal alignment. Mild narrowing of C5-6 and C6-7 interspaces with endplate spurring. Facet DJD bilaterally C2-3, C3-4, C4-5. No prevertebral soft tissue swelling. Negative for fracture. Asymmetric pleural-based irregular opacity at the in the right lung apex. Endotracheal tube partially seen.  IMPRESSION: 1. Negative for bleed or other acute intracranial abnormality. 2. Negative cervical fracture or other acute bone abnormality. 3. Multilevel cervical degenerative changes as above. 4. Pleural-based right apical lung opacity, incompletely characterized.   Electronically Signed   By: Arne Cleveland M.D.   On: 11/13/2013 10:21   Dg Chest Portable 1 View  10/29/2013   CLINICAL DATA:  ALTERED MENTAL STATUS ALTERED MENTAL STATUS, intubated  EXAM: PORTABLE CHEST - 1 VIEW  COMPARISON:  09/02/2013  FINDINGS: Endotracheal tube tip is approximately 4 cm above carina. Nasogastric tube extends at least as far as the stomach, tip not seen. Surgical clips in bilateral axilla. Mild cardiomegaly. Relatively low lung volumes with coarse predominantly perihilar interstitial opacities. . No effusion. Visualized skeletal structures are unremarkable.  IMPRESSION: 1. Endotracheal tube and nasogastric tube placement as above. 2. Coarse perihilar infiltrates or edema.   Electronically Signed   By: Arne Cleveland M.D.   On: 10/21/2013 10:35     ASSESSMENT / PLAN:  PULMONARY Acute respiratory failure - r/t AMS  ?HCAP v aspiration PNA Hx COPD  P:   Vent support  Goals sats >92%  F/u CXR  F/u ABG  abx as above  PRN BD  If requires longer ventilation, would change to oral ETT  CARDIOVASCULAR Hx HTN  P:  Hold  home cozaar and propanolol  Cycle enzymes   RENAL Acute renal failure - unknown baseline  Hyponatremia - mild  P:   Gentle volume  F/u chem   GASTROINTESTINAL No active issue  P:   PPI  Consider TF 5/26, if not extubated  HEMATOLOGIC Anemia - mild  Leukocytosis  P:  F/u cbc  Heparin for DVT proph   INFECTIOUS Sepsis ?HCAP vs aspiration pna Cellulitis - R axilla wound , on wound vac P:   Wound care to see  Empiric abx  Pan culture  Lactate normal   ENDOCRINE DM Hypoglycemia - with presumed hypoglycemic encephalopathy  P:   D10 gtt @ 50/h Hold home oral DM meds   NEUROLOGIC AMS - presumed hypoglycemic encephalopathy +/- seizure ?seizure  P:   RASS goal: -1 EEG Neuro to see - d/w Dr. Aram Beecham  Propofol  keppra  Versed PRN seizure     Nickolas Madrid, NP 10/15/2013  1:01 PM Pager: [phone] or [phone]    Summary - updated brother larry with above plan  I have personally obtained a history, examined the patient, evaluated laboratory and imaging results, formulated the assessment and plan and placed orders. CRITICAL CARE: The patient is critically ill with multiple organ systems failure and requires high complexity decision making for assessment and support, frequent evaluation and titration of therapies, application of advanced monitoring technologies and extensive interpretation of multiple databases. Critical Care Time devoted to patient care services described in this note is 60 minutes.   Kara Mead MD. Shade Flood. [facility] Pager [phone] If no response call [phone]

## 2013-11-08 NOTE — ED Provider Notes (Signed)
CSN: [id number]     Arrival date & time 10/22/2013  6063 History   First MD Initiated Contact with Patient 11/06/2013 [phone]     Chief Complaint  Patient presents with  . Altered Mental Status      The history is provided by the EMS personnel. The history is limited by the condition of the patient.    Patient here by ems from home, unresponsive found by caregiver in bed, pt fell yesterday seen by ems but did not have transport, per ems she was clenched with pupils dilated, pt has wound vac to right breast area. Arrived with nasal intubation and 20 g in left hand, pt received versed in route.         Past Medical History  Diagnosis Date  . Diabetes mellitus without complication   . Hypertension   . Arthritis   . Cancer     breast CA and skin CA    Past Surgical History  Procedure Laterality Date  . Abdominal hysterectomy    . Breast surgery  1988    bilateral  . Tonsillectomy     Family History  Problem Relation Age of Onset  . Stroke Mother   . Diabetes type II Sister   . Diabetes type II Brother    History  Substance Use Topics  . Smoking status: Never Smoker   . Smokeless tobacco: Not on file  . Alcohol Use: No   OB History   Grav Para Term Preterm Abortions TAB SAB Ect Mult Living                 Review of Systems  Unable to perform ROS: Intubated  Endocrine: Positive for polydipsia.      Allergies  Review of patient's allergies indicates no known allergies.  Home Medications   Prior to Admission medications   Medication Sig Start Date End Date Taking? Authorizing Provider  albuterol (PROVENTIL HFA;VENTOLIN HFA) 108 (90 BASE) MCG/ACT inhaler Inhale 2 puffs into the lungs every 6 (six) hours as needed for wheezing. 06/11/12  Yes Charlynne Cousins, MD  aspirin EC 81 MG tablet Take 81 mg by mouth daily.   Yes Historical Provider, MD  atorvastatin (LIPITOR) 10 MG tablet Take 10 mg by mouth daily.   Yes Historical Provider, MD  calcium-vitamin D (OSCAL WITH  D) 500-200 MG-UNIT per tablet Take 1 tablet by mouth 2 (two) times daily.   Yes Historical Provider, MD  collagenase (SANTYL) ointment Apply 1 application topically 3 (three) times a week. Three times weekly for wound vac change.   Yes Historical Provider, MD  fluticasone (FLONASE) 50 MCG/ACT nasal spray Place 2 sprays into both nostrils daily.   Yes Historical Provider, MD  Fluticasone-Salmeterol (ADVAIR) 250-50 MCG/DOSE AEPB Inhale 1 puff into the lungs every 12 (twelve) hours.   Yes Historical Provider, MD  glipiZIDE (GLUCOTROL XL) 10 MG 24 hr tablet Take 10 mg by mouth daily.   Yes Historical Provider, MD  letrozole (FEMARA) 2.5 MG tablet Take 2.5 mg by mouth daily.   Yes Historical Provider, MD  losartan (COZAAR) 100 MG tablet Take 100 mg by mouth daily.   Yes Historical Provider, MD  metFORMIN (GLUCOPHAGE) 1000 MG tablet Take 1,000 mg by mouth daily with breakfast.   Yes Historical Provider, MD  pantoprazole (PROTONIX) 40 MG tablet Take 40 mg by mouth daily.   Yes Historical Provider, MD  propranolol (INNOPRAN XL) 120 MG 24 hr capsule Take 120 mg by mouth at bedtime.  Yes Historical Provider, MD  torsemide (DEMADEX) 10 MG tablet Take 10 mg by mouth daily.   Yes Historical Provider, MD   BP 134/46  Pulse 98  Temp(Src) 98.2 F (36.8 C) (Core (Comment))  Resp 23  Ht 5\' 5"  (1.651 m)  Wt 177 lb 11.1 oz (80.6 kg)  BMI 29.57 kg/m2  SpO2 99% Physical Exam  Nursing note and vitals reviewed. Constitutional: She appears well-developed and well-nourished. She appears distressed. She is sedated and intubated. Backboard in place.  HENT:  Head: Normocephalic and atraumatic.  Eyes: Pupils are equal, round, and reactive to light.  Neck: Normal range of motion.  Cardiovascular: Normal rate and intact distal pulses.   Pulmonary/Chest: She is intubated. She is in respiratory distress. She has wheezes.  Abdominal: Normal appearance. She exhibits no distension.  Musculoskeletal: Normal range of  motion.  Neurological: She is unresponsive. She displays no seizure activity. GCS eye subscore is 2. GCS verbal subscore is 1. GCS motor subscore is 1.  Skin: Skin is warm and dry. No rash noted.    ED Course  Procedures (including critical care time)  CRITICAL CARE Performed by: Nelia Shi Total critical care time: 45 min Critical care time was exclusive of separately billable procedures and treating other patients. Critical care was necessary to treat or prevent imminent or life-threatening deterioration. Critical care was time spent personally by me on the following activities: development of treatment plan with patient and/or surrogate as well as nursing, discussions with consultants, evaluation of patient's response to treatment, examination of patient, obtaining history from patient or surrogate, ordering and performing treatments and interventions, ordering and review of laboratory studies, ordering and review of radiographic studies, pulse oximetry and re-evaluation of patient's condition.  Labs Review Labs Reviewed  CBC WITH DIFFERENTIAL - Abnormal; Notable for the following:    WBC 26.2 (*)    RBC 3.34 (*)    Hemoglobin 10.5 (*)    HCT 31.1 (*)    Neutrophils Relative % 91 (*)    Lymphocytes Relative 3 (*)    Neutro Abs 23.8 (*)    Monocytes Absolute 1.6 (*)    All other components within normal limits  COMPREHENSIVE METABOLIC PANEL - Abnormal; Notable for the following:    Sodium 133 (*)    CO2 18 (*)    BUN 39 (*)    Creatinine, Ser 2.14 (*)    Calcium 7.8 (*)    Albumin 2.5 (*)    GFR calc non Af Amer 21 (*)    GFR calc Af Amer 24 (*)    All other components within normal limits  URINALYSIS, ROUTINE W REFLEX MICROSCOPIC - Abnormal; Notable for the following:    APPearance CLOUDY (*)    All other components within normal limits  CBC - Abnormal; Notable for the following:    WBC 27.3 (*)    RBC 3.46 (*)    Hemoglobin 11.0 (*)    HCT 32.9 (*)    All other  components within normal limits  CREATININE, SERUM - Abnormal; Notable for the following:    Creatinine, Ser 2.03 (*)    GFR calc non Af Amer 22 (*)    GFR calc Af Amer 25 (*)    All other components within normal limits  BASIC METABOLIC PANEL - Abnormal; Notable for the following:    Sodium 134 (*)    Potassium 3.5 (*)    Glucose, Bld 64 (*)    BUN 34 (*)  Creatinine, Ser 1.85 (*)    Calcium 7.5 (*)    GFR calc non Af Amer 25 (*)    GFR calc Af Amer 29 (*)    All other components within normal limits  CBC - Abnormal; Notable for the following:    WBC 29.5 (*)    RBC 3.20 (*)    Hemoglobin 10.2 (*)    HCT 29.9 (*)    All other components within normal limits  BASIC METABOLIC PANEL - Abnormal; Notable for the following:    Sodium 131 (*)    Potassium 3.6 (*)    CO2 17 (*)    Glucose, Bld 148 (*)    BUN 35 (*)    Creatinine, Ser 2.22 (*)    Calcium 7.1 (*)    GFR calc non Af Amer 20 (*)    GFR calc Af Amer 23 (*)    All other components within normal limits  MAGNESIUM - Abnormal; Notable for the following:    Magnesium 0.9 (*)    All other components within normal limits  GLUCOSE, CAPILLARY - Abnormal; Notable for the following:    Glucose-Capillary 66 (*)    All other components within normal limits  GLUCOSE, CAPILLARY - Abnormal; Notable for the following:    Glucose-Capillary 168 (*)    All other components within normal limits  GLUCOSE, CAPILLARY - Abnormal; Notable for the following:    Glucose-Capillary 138 (*)    All other components within normal limits  GLUCOSE, CAPILLARY - Abnormal; Notable for the following:    Glucose-Capillary 125 (*)    All other components within normal limits  GLUCOSE, CAPILLARY - Abnormal; Notable for the following:    Glucose-Capillary 40 (*)    All other components within normal limits  HEPATIC FUNCTION PANEL - Abnormal; Notable for the following:    Albumin 2.1 (*)    Indirect Bilirubin 0.1 (*)    All other components within  normal limits  GLUCOSE, CAPILLARY - Abnormal; Notable for the following:    Glucose-Capillary 156 (*)    All other components within normal limits  GLUCOSE, CAPILLARY - Abnormal; Notable for the following:    Glucose-Capillary 217 (*)    All other components within normal limits  BASIC METABOLIC PANEL - Abnormal; Notable for the following:    Sodium 130 (*)    Chloride 95 (*)    CO2 18 (*)    Glucose, Bld 303 (*)    BUN 40 (*)    Creatinine, Ser 2.52 (*)    Calcium 7.3 (*)    GFR calc non Af Amer 17 (*)    GFR calc Af Amer 20 (*)    All other components within normal limits  CBC - Abnormal; Notable for the following:    WBC 24.8 (*)    RBC 3.07 (*)    Hemoglobin 9.7 (*)    HCT 28.4 (*)    All other components within normal limits  GLUCOSE, CAPILLARY - Abnormal; Notable for the following:    Glucose-Capillary 265 (*)    All other components within normal limits  GLUCOSE, CAPILLARY - Abnormal; Notable for the following:    Glucose-Capillary 266 (*)    All other components within normal limits  GLUCOSE, CAPILLARY - Abnormal; Notable for the following:    Glucose-Capillary 249 (*)    All other components within normal limits  GLUCOSE, CAPILLARY - Abnormal; Notable for the following:    Glucose-Capillary 307 (*)    All other  components within normal limits  GLUCOSE, CAPILLARY - Abnormal; Notable for the following:    Glucose-Capillary 294 (*)    All other components within normal limits  GLUCOSE, CAPILLARY - Abnormal; Notable for the following:    Glucose-Capillary 221 (*)    All other components within normal limits  BASIC METABOLIC PANEL - Abnormal; Notable for the following:    Sodium 136 (*)    Potassium 3.2 (*)    CO2 18 (*)    Glucose, Bld 288 (*)    BUN 50 (*)    Creatinine, Ser 2.36 (*)    Calcium 7.5 (*)    GFR calc non Af Amer 18 (*)    GFR calc Af Amer 21 (*)    All other components within normal limits  CBC - Abnormal; Notable for the following:    WBC  16.1 (*)    RBC 3.03 (*)    Hemoglobin 9.5 (*)    HCT 28.3 (*)    All other components within normal limits  GLUCOSE, CAPILLARY - Abnormal; Notable for the following:    Glucose-Capillary 259 (*)    All other components within normal limits  GLUCOSE, CAPILLARY - Abnormal; Notable for the following:    Glucose-Capillary 247 (*)    All other components within normal limits  GLUCOSE, CAPILLARY - Abnormal; Notable for the following:    Glucose-Capillary 257 (*)    All other components within normal limits  CBG MONITORING, ED - Abnormal; Notable for the following:    Glucose-Capillary 135 (*)    All other components within normal limits  I-STAT ARTERIAL BLOOD GAS, ED - Abnormal; Notable for the following:    pH, Arterial 7.285 (*)    pO2, Arterial 490.0 (*)    Acid-base deficit 5.0 (*)    All other components within normal limits  URINE CULTURE  CULTURE, BLOOD (ROUTINE X 2)  CULTURE, BLOOD (ROUTINE X 2)  CULTURE, RESPIRATORY (NON-EXPECTORATED)  MRSA PCR SCREENING  LACTIC ACID, PLASMA  PROTIME-INR  PROCALCITONIN  TROPONIN I  SODIUM, URINE, RANDOM  CREATININE, URINE, RANDOM  TRIGLYCERIDES  GLUCOSE, CAPILLARY  PHOSPHORUS  GLUCOSE, CAPILLARY  GLUCOSE, CAPILLARY  TSH  CORTISOL  MAGNESIUM  PHOSPHORUS  TRIGLYCERIDES    Imaging Review Mr Brain Wo Contrast  11/10/2013   CLINICAL DATA:  Altered mental status. The patient is unresponsive. Patient is on a ventilator with nasal intubation. The examination had to be discontinued prior to completion due to unstable airway.  EXAM: MRI HEAD WITHOUT CONTRAST  TECHNIQUE: Multiplanar, multiecho pulse sequences of the brain and surrounding structures were obtained without intravenous contrast.  COMPARISON:  CT head and cervical spine from the same day.  FINDINGS: The diffusion-weighted images demonstrate no evidence for acute or subacute infarction.  No hemorrhage or mass lesion is present. Moderate periventricular and subcortical white matter  changes are present bilaterally. Abnormal FLAIR signal within the subarachnoid spaces on the FLAIR sequences likely related to ventilation on oxygen tension.  Flow is present in the major intracranial arteries. The patient is status post left lens replacement. Mild mucosal thickening is present in the anterior ethmoid air cells and frontal sinuses bilaterally. There is a fluid level in the right sphenoid sinus. Minimal prior sub minimal fluid in the mastoid air cells may be related to nasal intubation. There is fluid in the nasopharynx.  IMPRESSION: 1. No evidence for acute or subacute infarction. 2. Age advanced white matter disease. This is nonspecific, but likely reflects the sequela of chronic microvascular  ischemia. 3. Sinus disease is likely related to nasal intubation.   Electronically Signed   By: Lawrence Santiago M.D.   On: 11/10/2013 20:01   Dg Chest Port 1 View  11/10/2013   CLINICAL DATA:  Follow-up pneumonia.  EXAM: PORTABLE CHEST - 1 VIEW  COMPARISON:  11/09/2013  FINDINGS: Endotracheal tube remains in place with tip at the level of the clavicular heads, well above the carina. Enteric tube remains in place with with tip in the region of the gastric body and side hole likely just beyond the GE junction. Left jugular central venous catheter remains with tip overlying the upper SVC.  Cardiomediastinal silhouette is within normal limits. Surgical clips are again seen in both axillae. Patchy opacity in the left lower lobe is unchanged. Mildly prominent interstitial markings throughout the right lung are unchanged. There is no evidence of new airspace consolidation. No definite pleural effusion or pneumothorax is identified.  IMPRESSION: 1. Unchanged left lower lobe infiltrate consistent with pneumonia. 2. Unchanged appearance of support apparatus.   Electronically Signed   By: Logan Bores   On: 11/10/2013 08:06     EKG Interpretation   Date/Time:  Monday Nov 08 2013 08:54:04 EDT Ventricular Rate:   80 PR Interval:  166 QRS Duration: 93 QT Interval:  386 QTC Calculation: 445 R Axis:   101 Text Interpretation:  Right and left arm electrode reversal,  interpretation assumes no reversal Sinus rhythm Probable lateral infarct,  age indeterminate No previous tracing Confirmed by Kynleigh Artz  MD, Khyler Eschmann  [phone]) on 11/03/2013 9:55:26 AM      MDM   Final diagnoses:  Community acquired pneumonia  Mental status, decreased  Respiratory failure        Dot Lanes, MD 11/12/13 [phone]

## 2013-11-08 NOTE — ED Notes (Signed)
Patient here by ems from home, unresponsive found by caregiver in bed, pt fell yesterday seen by ems but did not have transport, per ems she was clenched with pupils dilated, pt has wound vac to right breast area. Arrived with nasal intubation and 20 g in left hand, pt received versed in route.

## 2013-11-08 NOTE — Procedures (Signed)
Central Venous Catheter Insertion Procedure Note Shelley Glover February 16, 1932  Procedure: Insertion of Central Venous Catheter Indications: Assessment of intravascular volume and Drug and/or fluid administration  Procedure Details Consent: Risks of procedure as well as the alternatives and risks of each were explained to the (patient/caregiver).  Consent for procedure obtained. Time Out: Verified patient identification, verified procedure, site/side was marked, verified correct patient position, special equipment/implants available, medications/allergies/relevent history reviewed, required imaging and test results available.  Performed  Maximum sterile technique was used including antiseptics, cap, gloves, gown, hand hygiene, mask and sheet. Skin prep: Chlorhexidine; local anesthetic administered A antimicrobial bonded/coated triple lumen catheter was placed in the left internal jugular vein using the Seldinger technique.  Evaluation Blood flow good Complications: No apparent complications Patient did tolerate procedure well. Chest X-ray ordered to verify placement.  CXR: pending.  Performed under direct MD supervision.  Performed using ultrasound guidance.  Wire visualized in vessel under ultrasound.   Nickolas Madrid, NP  Rigoberto Noel MD 11/07/2013  3:52 PM

## 2013-11-08 NOTE — ED Notes (Signed)
MD at bedside. 

## 2013-11-09 ENCOUNTER — Inpatient Hospital Stay (HOSPITAL_COMMUNITY): Payer: Medicare Other

## 2013-11-09 LAB — CBC
HEMATOCRIT: 29.9 % — AB (ref 36.0–46.0)
HEMOGLOBIN: 10.2 g/dL — AB (ref 12.0–15.0)
MCH: 31.9 pg (ref 26.0–34.0)
MCHC: 34.1 g/dL (ref 30.0–36.0)
MCV: 93.4 fL (ref 78.0–100.0)
Platelets: 299 10*3/uL (ref 150–400)
RBC: 3.2 MIL/uL — ABNORMAL LOW (ref 3.87–5.11)
RDW: 15 % (ref 11.5–15.5)
WBC: 29.5 10*3/uL — ABNORMAL HIGH (ref 4.0–10.5)

## 2013-11-09 LAB — HEPATIC FUNCTION PANEL
ALK PHOS: 100 U/L (ref 39–117)
ALT: 12 U/L (ref 0–35)
AST: 27 U/L (ref 0–37)
Albumin: 2.1 g/dL — ABNORMAL LOW (ref 3.5–5.2)
BILIRUBIN DIRECT: 0.3 mg/dL (ref 0.0–0.3)
BILIRUBIN INDIRECT: 0.1 mg/dL — AB (ref 0.3–0.9)
BILIRUBIN TOTAL: 0.4 mg/dL (ref 0.3–1.2)
Total Protein: 6 g/dL (ref 6.0–8.3)

## 2013-11-09 LAB — CORTISOL: Cortisol, Plasma: 24.4 ug/dL

## 2013-11-09 LAB — BASIC METABOLIC PANEL
BUN: 35 mg/dL — AB (ref 6–23)
CHLORIDE: 98 meq/L (ref 96–112)
CO2: 17 mEq/L — ABNORMAL LOW (ref 19–32)
Calcium: 7.1 mg/dL — ABNORMAL LOW (ref 8.4–10.5)
Creatinine, Ser: 2.22 mg/dL — ABNORMAL HIGH (ref 0.50–1.10)
GFR calc Af Amer: 23 mL/min — ABNORMAL LOW (ref >90)
GFR, EST NON AFRICAN AMERICAN: 20 mL/min — AB (ref >90)
GLUCOSE: 148 mg/dL — AB (ref 70–99)
Potassium: 3.6 mEq/L — ABNORMAL LOW (ref 3.7–5.3)
Sodium: 131 mEq/L — ABNORMAL LOW (ref 137–147)

## 2013-11-09 LAB — GLUCOSE, CAPILLARY
GLUCOSE-CAPILLARY: 125 mg/dL — AB (ref 70–99)
GLUCOSE-CAPILLARY: 156 mg/dL — AB (ref 70–99)
GLUCOSE-CAPILLARY: 266 mg/dL — AB (ref 70–99)
GLUCOSE-CAPILLARY: 74 mg/dL (ref 70–99)
Glucose-Capillary: 217 mg/dL — ABNORMAL HIGH (ref 70–99)
Glucose-Capillary: 265 mg/dL — ABNORMAL HIGH (ref 70–99)
Glucose-Capillary: 40 mg/dL — CL (ref 70–99)
Glucose-Capillary: 95 mg/dL (ref 70–99)

## 2013-11-09 LAB — MAGNESIUM: Magnesium: 0.9 mg/dL — CL (ref 1.5–2.5)

## 2013-11-09 LAB — PHOSPHORUS: Phosphorus: 3.6 mg/dL (ref 2.3–4.6)

## 2013-11-09 LAB — TSH: TSH: 2.01 u[IU]/mL (ref 0.350–4.500)

## 2013-11-09 MED ORDER — VITAL HIGH PROTEIN PO LIQD
1000.0000 mL | ORAL | Status: DC
  Administered 2013-11-09 – 2013-11-12 (×3): 1000 mL
  Filled 2013-11-09 (×10): qty 1000

## 2013-11-09 MED ORDER — VITAL HIGH PROTEIN PO LIQD
1000.0000 mL | ORAL | Status: DC
  Filled 2013-11-09 (×2): qty 1000

## 2013-11-09 MED ORDER — MAGNESIUM SULFATE 4000MG/100ML IJ SOLN
4.0000 g | Freq: Once | INTRAMUSCULAR | Status: AC
  Administered 2013-11-09: 4 g via INTRAVENOUS
  Filled 2013-11-09: qty 100

## 2013-11-09 MED ORDER — DEXTROSE 50 % IV SOLN
25.0000 mL | Freq: Once | INTRAVENOUS | Status: AC
  Administered 2013-11-09: 25 mL via INTRAVENOUS

## 2013-11-09 MED ORDER — ALBUTEROL SULFATE (2.5 MG/3ML) 0.083% IN NEBU
2.5000 mg | INHALATION_SOLUTION | RESPIRATORY_TRACT | Status: DC | PRN
  Administered 2013-11-12: 2.5 mg via RESPIRATORY_TRACT
  Filled 2013-11-09: qty 3

## 2013-11-09 MED ORDER — PANTOPRAZOLE SODIUM 40 MG PO PACK
40.0000 mg | PACK | ORAL | Status: DC
  Administered 2013-11-09 – 2013-11-14 (×6): 40 mg
  Filled 2013-11-09 (×6): qty 20

## 2013-11-09 MED ORDER — DEXTROSE 50 % IV SOLN
INTRAVENOUS | Status: AC
  Administered 2013-11-09: 25 mL via INTRAVENOUS
  Filled 2013-11-09: qty 50

## 2013-11-09 MED ORDER — PRO-STAT SUGAR FREE PO LIQD
30.0000 mL | Freq: Every day | ORAL | Status: DC
  Administered 2013-11-09 – 2013-11-11 (×3): 30 mL
  Filled 2013-11-09 (×4): qty 30

## 2013-11-09 MED ORDER — INSULIN ASPART 100 UNIT/ML ~~LOC~~ SOLN
0.0000 [IU] | SUBCUTANEOUS | Status: DC
  Administered 2013-11-09 – 2013-11-10 (×2): 5 [IU] via SUBCUTANEOUS
  Administered 2013-11-10: 7 [IU] via SUBCUTANEOUS
  Administered 2013-11-10 (×2): 3 [IU] via SUBCUTANEOUS
  Administered 2013-11-10: 5 [IU] via SUBCUTANEOUS
  Administered 2013-11-11: 7 [IU] via SUBCUTANEOUS
  Administered 2013-11-11 (×2): 5 [IU] via SUBCUTANEOUS
  Administered 2013-11-11: 3 [IU] via SUBCUTANEOUS
  Administered 2013-11-11: 7 [IU] via SUBCUTANEOUS
  Administered 2013-11-11 – 2013-11-12 (×2): 9 [IU] via SUBCUTANEOUS
  Administered 2013-11-12 (×4): 7 [IU] via SUBCUTANEOUS
  Administered 2013-11-12: 9 [IU] via SUBCUTANEOUS
  Administered 2013-11-13 (×2): 5 [IU] via SUBCUTANEOUS
  Administered 2013-11-13: 9 [IU] via SUBCUTANEOUS
  Administered 2013-11-13: 7 [IU] via SUBCUTANEOUS
  Administered 2013-11-13: 5 [IU] via SUBCUTANEOUS
  Administered 2013-11-13: 7 [IU] via SUBCUTANEOUS
  Administered 2013-11-14: 3 [IU] via SUBCUTANEOUS
  Administered 2013-11-14: 5 [IU] via SUBCUTANEOUS
  Administered 2013-11-14: 3 [IU] via SUBCUTANEOUS

## 2013-11-09 NOTE — Progress Notes (Signed)
CRITICAL VALUE ALERT  Critical value received:  Mag 0.9  Date of notification:  11/09/13  Time of notification:  0613  Critical value read back:yes  Nurse who received alert:  Vista Lawman RN  MD notified (1st page):  Dr. Jimmy Footman  Time of first page:  [phone]  MD notified (2nd page):  Time of second page:  Responding MD: Dr, Jimmy Footman  Time MD responded:  [phone]

## 2013-11-09 NOTE — Consult Note (Addendum)
WOC wound consult note Reason for Consult: Consult requested for right axilla vac dressing change.  Pt was on a Vac at home before admission according to progress notes.  She has been placed on the hospital Vac machine at this time.  She is unresponsive and no family present in room to ask questions regarding who is following her wound as an outpatient. Wound type: Full thickness post-op wound Measurement: 4X6X1cm with tunneling to 1 cm at 9:00 o'clock Wound bed: 20% yellow, 80% red Drainage (amount, consistency, odor) Small amt pink-yellow drainage in cannister with strong odor Periwound: Edges of wound are red and macerated to 3 cm surrounding wound. Dressing procedure/placement/frequency: Applied one piece black foam to 162mm cont suction and bridged track pad with another piece of foam away from axilla area to avoid pressure. Bedside nurse can change dressing on Tues/Thurs/Sat Julien Girt MSN, RN, [place], [place], [place]

## 2013-11-09 NOTE — Progress Notes (Signed)
Patient nasally intubated. OG tube in place and verified. Noted dried emesis on right cheek. Cleansed area. Dr. Nelda Marseille notified as well as Rolm Bookbinder, NP.

## 2013-11-09 NOTE — Progress Notes (Signed)
INITIAL NUTRITION ASSESSMENT  DOCUMENTATION CODES Per approved criteria  -Not Applicable   INTERVENTION: Initiate Vital HP formula at 20 ml/hr and increase by 10 ml every 4 hours to goal rate of 60 ml/hr with Prostat liquid protein 30 ml daily to provide 1540 kcals, 141 gm protein, 1204 ml of free water  Rest of estimated kcals to be met with current Propofol infusion RD to follow for nutrition care plan  NUTRITION DIAGNOSIS: Inadequate oral intake related to inability to eat as evidenced by NPO status  Goal: Pt to meet >/= 90% of their estimated nutrition needs   Monitor:  TF regimen & tolerance, respiratory status, weight, labs, I/O's  Reason for Assessment: Consult  80 y.o. female  Admitting Dx: AMS, respiratory failure  ASSESSMENT: 80 yo female with hx DM, HTN, breast cancer with significant radiation wounds to R axilla with wound VAC. Had accidental fall at home 5/24, no LOC, seen by EMS but refused ER. On 5/25 caregiver found pt unresponsive. Pt with agonal resps, hypothermic, hypoglycemic.   Patient is currently intubated on ventilator support -- OGT in place MV: 13.2 L/min Temp (24hrs), Avg:101.5 F (38.6 C), Min:99.7 F (37.6 C), Max:103 F (39.4 C)  Propofol: 17.9 ml/hr -- 473 fat kcals  Neurology following.  Awaiting MRI of brain and EEG.  RD consulted for TF initiation & management.  Height: Ht Readings from Last 1 Encounters:  10/26/2013 $RemoveB'5\' 5"'tjUSVBcY$  (1.651 m)    Weight: Wt Readings from Last 1 Encounters:  11/09/13 176 lb 5.9 oz (80 kg)    Ideal Body Weight: 125 lb  % Ideal Body Weight: 141%  Wt Readings from Last 10 Encounters:  11/09/13 176 lb 5.9 oz (80 kg)  06/11/12 188 lb (85.276 kg)    Usual Body Weight: unable to obtain  % Usual Body Weight: ---  BMI:  Body mass index is 29.35 kg/(m^2).  Estimated Nutritional Needs: Kcal: 2000-2150 Protein: 135-145 gm  Fluid: per MD  Skin: wound VAC to right axilla  Diet Order: NPO  EDUCATION  NEEDS: -No education needs identified at this time   Intake/Output Summary (Last 24 hours) at 11/09/13 1353 Last data filed at 11/09/13 1300  Gross per 24 hour  Intake 3178.2 ml  Output   2015 ml  Net 1163.2 ml    Labs:   Recent Labs Lab 10/20/2013 0859 11/09/2013 1509 10/28/2013 1700 11/09/13 0500  NA 133*  --  134* 131*  K 3.8  --  3.5* 3.6*  CL 99  --  98 98  CO2 18*  --  19 17*  BUN 39*  --  34* 35*  CREATININE 2.14* 2.03* 1.85* 2.22*  CALCIUM 7.8*  --  7.5* 7.1*  MG  --   --   --  0.9*  PHOS  --   --   --  3.6  GLUCOSE 85  --  64* 148*    CBG (last 3)   Recent Labs  11/09/13 0318 11/09/13 0807 11/09/13 1210  GLUCAP 74 125* 156*    Scheduled Meds: . ampicillin-sulbactam (UNASYN) IV  1.5 g Intravenous Q12H  . antiseptic oral rinse  15 mL Mouth Rinse q12n4p  . chlorhexidine  15 mL Mouth Rinse BID  . feeding supplement (VITAL HIGH PROTEIN)  1,000 mL Per Tube Q24H  . heparin  5,000 Units Subcutaneous 3 times per day  . pantoprazole sodium  40 mg Per Tube Q24H  . sodium chloride  10-40 mL Intracatheter Q12H  . vancomycin  750 mg Intravenous Q24H    Continuous Infusions: . dextrose 75 mL/hr at 11/09/13 0856  . propofol 35 mcg/kg/min (11/09/13 1100)    Past Medical History  Diagnosis Date  . Diabetes mellitus without complication   . Hypertension   . Arthritis   . Cancer     breast CA and skin CA     Past Surgical History  Procedure Laterality Date  . Abdominal hysterectomy    . Breast surgery  1988    bilateral  . Tonsillectomy      Arthur Holms, RD, LDN Pager #: [phone] After-Hours Pager #: [phone]

## 2013-11-09 NOTE — Progress Notes (Signed)
EEG Completed; Results Pending  

## 2013-11-09 NOTE — Procedures (Signed)
EEG report.  Brief clinical history: 80 y.o. female with acute unresponsiveness of unclear etiology    Technique: this is a 18 channel routine scalp EEG performed at the bedside with bipolar and monopolar montages arranged in accordance to the international 10/20 system of electrode placement. One channel was dedicated to EKG recording.  The patient is sedated with propofol, intubated on the vent. No activating procedures feasible.  Description: For the most part, the recording is contaminated by prominent artifacts that impedes a reliable reading of the test. Very limited portions of the study appear to display generalized slowing without electrographic seizures Impression: this is a technically limited study and that doesn't allow a reliable interpretation of the test. A repeat study turning off the propofol is recommended.    Dorian Pod, MD [facility]

## 2013-11-09 NOTE — Progress Notes (Signed)
When decreasing prop to 67mcg becomes tachypnic, uses accessory muscles, and is labored. Increased sedation back to 32mcg

## 2013-11-09 NOTE — Progress Notes (Signed)
Called to inform Dr. Halford Chessman that patient's CBG was 217 when tube feeds have been started. Also made him aware that patient BP is on the lower side and SBP often hang in the low 90's with MAP in the 50's. Instructed to discontinue D10 fluids and no other new orders were received. Richardean Sale, RN

## 2013-11-09 NOTE — Progress Notes (Signed)
NEURO HOSPITALIST PROGRESS NOTE   SUBJECTIVE:                                                                                                                        Had some respiratory distress earlier today and propofol had to be increase. No further left foot movements noted after loading dose IV keppra in the ED. EEG and MRI brain pending. Mild hyponatremia, Cr 2.22, WBC 29.5, Mg 0.9. UA without frank infection. On vancomycin.  OBJECTIVE:                                                                                                                           Vital signs in last 24 hours: Temp:  [95.5 F (35.3 C)-103 F (39.4 C)] 100.3 F (37.9 C) (05/26 0800) Pulse Rate:  [75-104] 100 (05/26 0800) Resp:  [18-31] 31 (05/26 0800) BP: (77-170)/(38-131) 145/88 mmHg (05/26 0800) SpO2:  [98 %-100 %] 100 % (05/26 0800) FiO2 (%):  [0.3 %-30 %] 30 % (05/26 0805) Weight:  [76.1 kg (167 lb 12.3 oz)-85.276 kg (188 lb)] 80 kg (176 lb 5.9 oz) (05/26 0500)  Intake/Output from previous day: 05/25 0701 - 05/26 0700 In: 2579.8 [I.V.:1189.8; NG/GT:140; IV Piggyback:1250] Out: 4401 [Urine:1495; Emesis/NG output:250] Intake/Output this shift: Total I/O In: 99.1 [I.V.:99.1] Out: 60 [Urine:60] Nutritional status: NPO  Past Medical History  Diagnosis Date  . Diabetes mellitus without complication   . Hypertension   . Arthritis   . Cancer     breast CA and skin CA     Neurologic Exam:  On propofol, will defer neuro-exam  Lab Results: No results found for this basename: cbc, bmp, coags, chol, tri, ldl, hga1c   Lipid Panel  Recent Labs  December 07, 2013 1509  TRIG 106    Studies/Results: Ct Head Wo Contrast  12/07/2013   CLINICAL DATA:  Unresponsive, fell, dilated pupils.  EXAM: CT HEAD WITHOUT CONTRAST  CT CERVICAL SPINE WITHOUT CONTRAST  TECHNIQUE: Multidetector CT imaging of the head and cervical spine was performed following the standard protocol  without intravenous contrast. Multiplanar CT image reconstructions of the cervical spine were also generated.  COMPARISON:  06/08/2012  FINDINGS: CT HEAD FINDINGS  Mucoperiosteal thickening in ethmoid air cells.  There is no evidence of acute intracranial hemorrhage, brain edema, mass lesion, acute infarction, mass effect, or midline shift. Acute infarct may be inapparent on noncontrast CT. No other intra-axial abnormalities are seen, and the ventricles and sulci are within normal limits in size and symmetry. No abnormal extra-axial fluid collections or masses are identified. No significant calvarial abnormality.  CT CERVICAL SPINE FINDINGS  Normal alignment. Mild narrowing of C5-6 and C6-7 interspaces with endplate spurring. Facet DJD bilaterally C2-3, C3-4, C4-5. No prevertebral soft tissue swelling. Negative for fracture. Asymmetric pleural-based irregular opacity at the in the right lung apex. Endotracheal tube partially seen.  IMPRESSION: 1. Negative for bleed or other acute intracranial abnormality. 2. Negative cervical fracture or other acute bone abnormality. 3. Multilevel cervical degenerative changes as above. 4. Pleural-based right apical lung opacity, incompletely characterized.   Electronically Signed   By: Arne Cleveland M.D.   On: 10/26/2013 10:21   Ct Cervical Spine Wo Contrast  11/14/2013   CLINICAL DATA:  Unresponsive, fell, dilated pupils.  EXAM: CT HEAD WITHOUT CONTRAST  CT CERVICAL SPINE WITHOUT CONTRAST  TECHNIQUE: Multidetector CT imaging of the head and cervical spine was performed following the standard protocol without intravenous contrast. Multiplanar CT image reconstructions of the cervical spine were also generated.  COMPARISON:  06/08/2012  FINDINGS: CT HEAD FINDINGS  Mucoperiosteal thickening in ethmoid air cells.  There is no evidence of acute intracranial hemorrhage, brain edema, mass lesion, acute infarction, mass effect, or midline shift. Acute infarct may be inapparent on  noncontrast CT. No other intra-axial abnormalities are seen, and the ventricles and sulci are within normal limits in size and symmetry. No abnormal extra-axial fluid collections or masses are identified. No significant calvarial abnormality.  CT CERVICAL SPINE FINDINGS  Normal alignment. Mild narrowing of C5-6 and C6-7 interspaces with endplate spurring. Facet DJD bilaterally C2-3, C3-4, C4-5. No prevertebral soft tissue swelling. Negative for fracture. Asymmetric pleural-based irregular opacity at the in the right lung apex. Endotracheal tube partially seen.  IMPRESSION: 1. Negative for bleed or other acute intracranial abnormality. 2. Negative cervical fracture or other acute bone abnormality. 3. Multilevel cervical degenerative changes as above. 4. Pleural-based right apical lung opacity, incompletely characterized.   Electronically Signed   By: Arne Cleveland M.D.   On: 11/02/2013 10:21   Dg Chest Port 1 View  11/09/2013   CLINICAL DATA:  Aspiration pneumonia.  EXAM: PORTABLE CHEST - 1 VIEW  COMPARISON:  11/07/2013 and previous  FINDINGS: Endotracheal tube has its tip 4.5 cm above the carina. Left internal jugular central line has its tip in the SVC above the right atrium. Nasogastric tube enters the stomach. Patchy infiltrate in the left lower lobe consistent with aspiration pneumonia. This is somewhat more appreciable given the radiographic technique. The lung is not densely consolidated or collapsed. No effusion.  IMPRESSION: Patchy infiltrate in left lower lobe consistent with pneumonia, due to aspiration by history. No dense consolidation or major volume loss.   Electronically Signed   By: Nelson Chimes M.D.   On: 11/09/2013 08:11   Dg Chest Port 1 View  10/24/2013   CLINICAL DATA:  Central line placement  EXAM: PORTABLE CHEST - 1 VIEW  COMPARISON:  10/23/2013 at 9:30 a.m.  FINDINGS: Left internal jugular central venous line has its tip in the lower superior vena cava, well positioned. There is no  pneumothorax.  Endotracheal tube and nasogastric tube remain stable in well positioned.  No other change from the earlier study.  IMPRESSION: Left  internal jugular central venous line is well positioned with its tip in the lower superior vena cava. No pneumothorax.   Electronically Signed   By: Lajean Manes M.D.   On: 10/21/2013 16:42   Dg Chest Portable 1 View  11/01/2013   CLINICAL DATA:  ALTERED MENTAL STATUS ALTERED MENTAL STATUS, intubated  EXAM: PORTABLE CHEST - 1 VIEW  COMPARISON:  09/02/2013  FINDINGS: Endotracheal tube tip is approximately 4 cm above carina. Nasogastric tube extends at least as far as the stomach, tip not seen. Surgical clips in bilateral axilla. Mild cardiomegaly. Relatively low lung volumes with coarse predominantly perihilar interstitial opacities. . No effusion. Visualized skeletal structures are unremarkable.  IMPRESSION: 1. Endotracheal tube and nasogastric tube placement as above. 2. Coarse perihilar infiltrates or edema.   Electronically Signed   By: Arne Cleveland M.D.   On: 10/26/2013 10:35    MEDICATIONS                                                                                                                        I have reviewed the patient's current medications. Scheduled: . ampicillin-sulbactam (UNASYN) IV  1.5 g Intravenous Q12H  . antiseptic oral rinse  15 mL Mouth Rinse q12n4p  . chlorhexidine  15 mL Mouth Rinse BID  . heparin  5,000 Units Subcutaneous 3 times per day  . pantoprazole (PROTONIX) IV  40 mg Intravenous QHS  . sodium chloride  10-40 mL Intracatheter Q12H  . vancomycin  750 mg Intravenous Q24H    ASSESSMENT/PLAN:                                                                                                            80 y.o. female with acute unresponsiveness of unclear etiology but with significantly elevated white count and elevated creatinine, low magnesium. Initial concern for focal motor seizure left foot that resolved after  loading dose IV keppra. Toxic metabolic encephalopathy versus CNS infection, may need LP if no obvious source of infection documented.  Awaiting MRI brain and EEG Will follow up.   Dorian Pod, MD [facility] [phone]  11/09/2013, 8:49 AM

## 2013-11-09 NOTE — Progress Notes (Signed)
PULMONARY / CRITICAL CARE MEDICINE   Name: Deania Siguenza MRN: [id number] DOB: September 30, 1931    ADMISSION DATE:  11/13/2013  REFERRING MD :  EDP PRIMARY SERVICE: PCCM  CHIEF COMPLAINT:  AMS, resp failure   BRIEF PATIENT DESCRIPTION:  80 yo female had fall w/o LOC on 5/24, seen by EMS and refused ER evaluation.  Found by caregiver 5/25 unresponsive.  In ER was hypothermic, hypoglycemia, dilated pupils, clenched jaw >> required nasal intubation in ED.  Hx of breast cancer s/p XRT with cellulitis and wound vac Rt axilla .  PCCM asked to admit.  SIGNIFICANT EVENTS: 5/25 Admit, neurology consulted  STUDIES:  CT head 5/25>>>  LINES / TUBES: ETT 5/25 (nasal) >>> Lt IJ CVL 5/25 >>   CULTURES: BCx2 5/25>>> Urine 5/25>>>negative Sputum 5/25>>>  ANTIBIOTICS: Vanc 5/25>>> unasyn 5/25>>>  SUBJECTIVE:  Remains on vent, sedation.  VITAL SIGNS: Temp:  [99.7 F (37.6 C)-103 F (39.4 C)] 100.4 F (38 C) (05/26 0900) Pulse Rate:  [75-104] 94 (05/26 0900) Resp:  [19-31] 28 (05/26 0900) BP: (77-170)/(38-105) 145/88 mmHg (05/26 0800) SpO2:  [98 %-100 %] 100 % (05/26 0900) FiO2 (%):  [0.3 %-30 %] 30 % (05/26 0805) Weight:  [167 lb 12.3 oz (76.1 kg)-188 lb (85.276 kg)] 176 lb 5.9 oz (80 kg) (05/26 0500) HEMODYNAMICS: CVP:  [6 mmHg-8 mmHg] 7 mmHg VENTILATOR SETTINGS: Vent Mode:  [-] PRVC FiO2 (%):  [0.3 %-30 %] 30 % Set Rate:  [18 bmp] 18 bmp Vt Set:  [460 mL] 460 mL PEEP:  [5 cmH20] 5 cmH20 Plateau Pressure:  [18 cmH20-21 cmH20] 18 cmH20 INTAKE / OUTPUT: Intake/Output     05/25 0701 - 05/26 0700 05/26 0701 - 05/27 0700   I.V. (mL/kg) 1189.8 (14.9) 99.1 (1.2)   NG/GT 140    IV Piggyback 1250    Total Intake(mL/kg) 2579.8 (32.2) 99.1 (1.2)   Urine (mL/kg/hr) 1495 60 (0.3)   Emesis/NG output 250    Total Output 1745 60   Net +834.8 +39.1        Stool Occurrence 2 x      PHYSICAL EXAMINATION: General: ill appearing Neuro: comatose HEENT: pupils reactive, nasal endotracheal  tube in place, OG tube in place Cardiovascular: regular Lungs: scattered rhonchi Abdomen:  Soft, non tender Musculoskeletal: 1+ ankle edema, Rt axilla wound vac in place  LABS:  CBC  Recent Labs Lab 10/30/2013 0859 10/26/2013 1509 11/09/13 0500  WBC 26.2* 27.3* 29.5*  HGB 10.5* 11.0* 10.2*  HCT 31.1* 32.9* 29.9*  PLT 316 289 299   Coag's  Recent Labs Lab 10/16/2013 0859  INR 1.22   BMET  Recent Labs Lab 10/29/2013 0859 10/27/2013 1509 11/05/2013 1700 11/09/13 0500  NA 133*  --  134* 131*  K 3.8  --  3.5* 3.6*  CL 99  --  98 98  CO2 18*  --  19 17*  BUN 39*  --  34* 35*  CREATININE 2.14* 2.03* 1.85* 2.22*  GLUCOSE 85  --  64* 148*   Electrolytes  Recent Labs Lab 11/06/2013 0859 10/17/2013 1700 11/09/13 0500  CALCIUM 7.8* 7.5* 7.1*  MG  --   --  0.9*  PHOS  --   --  3.6   Sepsis Markers  Recent Labs Lab 11/10/2013 0859 11/02/2013 1259  LATICACIDVEN 1.6  --   PROCALCITON  --  0.99   ABG  Recent Labs Lab 11/07/2013 0919  PHART 7.285*  PCO2ART 44.9  PO2ART 490.0*   Liver Enzymes  Recent Labs  Lab November 27, 2013 0859  AST 21  ALT 11  ALKPHOS 86  BILITOT 0.4  ALBUMIN 2.5*   Cardiac Enzymes  Recent Labs Lab 11/27/13 1301  TROPONINI <0.30   Glucose  Recent Labs Lab Nov 27, 2013 1827 11-27-2013 1858 Nov 27, 2013 1937 11/09/13 0002 11/09/13 0318 11/09/13 0807  GLUCAP 66* 168* 138* 95 74 125*    Imaging Ct Head Wo Contrast  11/27/2013   CLINICAL DATA:  Unresponsive, fell, dilated pupils.  EXAM: CT HEAD WITHOUT CONTRAST  CT CERVICAL SPINE WITHOUT CONTRAST  TECHNIQUE: Multidetector CT imaging of the head and cervical spine was performed following the standard protocol without intravenous contrast. Multiplanar CT image reconstructions of the cervical spine were also generated.  COMPARISON:  06/08/2012  FINDINGS: CT HEAD FINDINGS  Mucoperiosteal thickening in ethmoid air cells.  There is no evidence of acute intracranial hemorrhage, brain edema, mass lesion, acute  infarction, mass effect, or midline shift. Acute infarct may be inapparent on noncontrast CT. No other intra-axial abnormalities are seen, and the ventricles and sulci are within normal limits in size and symmetry. No abnormal extra-axial fluid collections or masses are identified. No significant calvarial abnormality.  CT CERVICAL SPINE FINDINGS  Normal alignment. Mild narrowing of C5-6 and C6-7 interspaces with endplate spurring. Facet DJD bilaterally C2-3, C3-4, C4-5. No prevertebral soft tissue swelling. Negative for fracture. Asymmetric pleural-based irregular opacity at the in the right lung apex. Endotracheal tube partially seen.  IMPRESSION: 1. Negative for bleed or other acute intracranial abnormality. 2. Negative cervical fracture or other acute bone abnormality. 3. Multilevel cervical degenerative changes as above. 4. Pleural-based right apical lung opacity, incompletely characterized.   Electronically Signed   By: Arne Cleveland M.D.   On: 27-Nov-2013 10:21   Ct Cervical Spine Wo Contrast  27-Nov-2013   CLINICAL DATA:  Unresponsive, fell, dilated pupils.  EXAM: CT HEAD WITHOUT CONTRAST  CT CERVICAL SPINE WITHOUT CONTRAST  TECHNIQUE: Multidetector CT imaging of the head and cervical spine was performed following the standard protocol without intravenous contrast. Multiplanar CT image reconstructions of the cervical spine were also generated.  COMPARISON:  06/08/2012  FINDINGS: CT HEAD FINDINGS  Mucoperiosteal thickening in ethmoid air cells.  There is no evidence of acute intracranial hemorrhage, brain edema, mass lesion, acute infarction, mass effect, or midline shift. Acute infarct may be inapparent on noncontrast CT. No other intra-axial abnormalities are seen, and the ventricles and sulci are within normal limits in size and symmetry. No abnormal extra-axial fluid collections or masses are identified. No significant calvarial abnormality.  CT CERVICAL SPINE FINDINGS  Normal alignment. Mild narrowing  of C5-6 and C6-7 interspaces with endplate spurring. Facet DJD bilaterally C2-3, C3-4, C4-5. No prevertebral soft tissue swelling. Negative for fracture. Asymmetric pleural-based irregular opacity at the in the right lung apex. Endotracheal tube partially seen.  IMPRESSION: 1. Negative for bleed or other acute intracranial abnormality. 2. Negative cervical fracture or other acute bone abnormality. 3. Multilevel cervical degenerative changes as above. 4. Pleural-based right apical lung opacity, incompletely characterized.   Electronically Signed   By: Arne Cleveland M.D.   On: 11/27/2013 10:21   Dg Chest Port 1 View  11/09/2013   CLINICAL DATA:  Aspiration pneumonia.  EXAM: PORTABLE CHEST - 1 VIEW  COMPARISON:  27-Nov-2013 and previous  FINDINGS: Endotracheal tube has its tip 4.5 cm above the carina. Left internal jugular central line has its tip in the SVC above the right atrium. Nasogastric tube enters the stomach. Patchy infiltrate in the left lower lobe consistent  with aspiration pneumonia. This is somewhat more appreciable given the radiographic technique. The lung is not densely consolidated or collapsed. No effusion.  IMPRESSION: Patchy infiltrate in left lower lobe consistent with pneumonia, due to aspiration by history. No dense consolidation or major volume loss.   Electronically Signed   By: Nelson Chimes M.D.   On: 11/09/2013 08:11   Dg Chest Port 1 View  Nov 25, 2013   CLINICAL DATA:  Central line placement  EXAM: PORTABLE CHEST - 1 VIEW  COMPARISON:  25-Nov-2013 at 9:30 a.m.  FINDINGS: Left internal jugular central venous line has its tip in the lower superior vena cava, well positioned. There is no pneumothorax.  Endotracheal tube and nasogastric tube remain stable in well positioned.  No other change from the earlier study.  IMPRESSION: Left internal jugular central venous line is well positioned with its tip in the lower superior vena cava. No pneumothorax.   Electronically Signed   By: Lajean Manes M.D.   On: 11-25-2013 16:42   Dg Chest Portable 1 View  25-Nov-2013   CLINICAL DATA:  ALTERED MENTAL STATUS ALTERED MENTAL STATUS, intubated  EXAM: PORTABLE CHEST - 1 VIEW  COMPARISON:  09/02/2013  FINDINGS: Endotracheal tube tip is approximately 4 cm above carina. Nasogastric tube extends at least as far as the stomach, tip not seen. Surgical clips in bilateral axilla. Mild cardiomegaly. Relatively low lung volumes with coarse predominantly perihilar interstitial opacities. . No effusion. Visualized skeletal structures are unremarkable.  IMPRESSION: 1. Endotracheal tube and nasogastric tube placement as above. 2. Coarse perihilar infiltrates or edema.   Electronically Signed   By: Arne Cleveland M.D.   On: Nov 25, 2013 10:35     ASSESSMENT / PLAN:  PULMONARY A: Acute respiratory failure 2nd to acute encephalopathy and aspiration pneumonia. Hx of COPD. P:   Full vent support F/u CXR PRN BD's  CARDIOVASCULAR A: Hx of HTN  P:  Monitor hemodynamics Hold outpt ASA, lipitor, cozaar, propranolol, torsemide  RENAL A: Acute renal failure >> unknown baseline renal fx. Hyponatremia - mild. P:   Continue IV fluids F/u renal fx, urine outpt electrolytes  GASTROINTESTINAL A: Nutrition. Hx of GERD. P:   Tube feeds while on vent Protonix for SUP  HEMATOLOGIC A: Anemia of critical illness and chronic disease. Leukocytosis. P:  F/u cbc  Heparin for DVT proph   INFECTIOUS A: Sepsis 2nd to aspiration PNA. Chronic Rt axillary cellulitis s/p wound vac. P:   Continue vancomycin, unasyn Wound care to follow  ENDOCRINE A: Hypoglycemia, hypothermia on admission. Hx of DM. P:   Continue D10 IV fluids for now Check cortisol, TSH Monitor CBG's Hold outpt glucotrol  NEUROLOGIC A: Acute encephalopathy 2nd to sepsis, AKI, respiratory failure, hypoglycemia, hypothermia +/- seizure. P:   F/u EEG Will need MRI >> defer timing to neurology RASS goal -1  Updated caregiver  at bedside.  CC time 35 minutes.  Chesley Mires, MD [facility] Pulmonary/Critical Care 11/09/2013, 10:10 AM Pager:  [phone] After 3pm call: [phone]

## 2013-11-09 NOTE — Progress Notes (Signed)
eLink Physician-Brief Progress Note Patient Name: Shelley Glover DOB: 1932-03-20 MRN: [id number]  Date of Service  11/09/2013   HPI/Events of Note  Patient had been hypoglycemic the prior 24 hours requiring D10 infusion.  This has been d/ced and now CBG increasing to greater than 200.  eICU Interventions  Plan: Start q4 hour SSI - sensitive scale Monitor blood sugar closely.   Intervention Category Intermediate Interventions: Hyperglycemia - evaluation and treatment  Shelley Glover 11/09/2013, 11:43 PM

## 2013-11-09 NOTE — Progress Notes (Signed)
eLink Physician-Brief Progress Note Patient Name: Shelley Glover DOB: Sep 25, 1931 MRN: [id number]  Date of Service  11/09/2013   HPI/Events of Note  Hypomag   eICU Interventions  Mag replaced   Intervention Category Intermediate Interventions: Electrolyte abnormality - evaluation and management  Guadelupe Sabin Elhadj Girton 11/09/2013, 6:22 AM

## 2013-11-10 ENCOUNTER — Inpatient Hospital Stay (HOSPITAL_COMMUNITY): Payer: Medicare Other

## 2013-11-10 DIAGNOSIS — J189 Pneumonia, unspecified organism: Secondary | ICD-10-CM

## 2013-11-10 LAB — GLUCOSE, CAPILLARY
GLUCOSE-CAPILLARY: 221 mg/dL — AB (ref 70–99)
Glucose-Capillary: 249 mg/dL — ABNORMAL HIGH (ref 70–99)
Glucose-Capillary: 307 mg/dL — ABNORMAL HIGH (ref 70–99)
Glucose-Capillary: 294 mg/dL — ABNORMAL HIGH (ref 70–99)
Glucose-Capillary: 259 mg/dL — ABNORMAL HIGH (ref 70–99)

## 2013-11-10 LAB — BASIC METABOLIC PANEL
BUN: 40 mg/dL — AB (ref 6–23)
CO2: 18 mEq/L — ABNORMAL LOW (ref 19–32)
CREATININE: 2.52 mg/dL — AB (ref 0.50–1.10)
Calcium: 7.3 mg/dL — ABNORMAL LOW (ref 8.4–10.5)
Chloride: 95 mEq/L — ABNORMAL LOW (ref 96–112)
GFR calc Af Amer: 20 mL/min — ABNORMAL LOW (ref >90)
GFR, EST NON AFRICAN AMERICAN: 17 mL/min — AB (ref >90)
Glucose, Bld: 303 mg/dL — ABNORMAL HIGH (ref 70–99)
Potassium: 3.8 mEq/L (ref 3.7–5.3)
Sodium: 130 mEq/L — ABNORMAL LOW (ref 137–147)

## 2013-11-10 LAB — CBC
HEMATOCRIT: 28.4 % — AB (ref 36.0–46.0)
Hemoglobin: 9.7 g/dL — ABNORMAL LOW (ref 12.0–15.0)
MCH: 31.6 pg (ref 26.0–34.0)
MCHC: 34.2 g/dL (ref 30.0–36.0)
MCV: 92.5 fL (ref 78.0–100.0)
Platelets: 285 10*3/uL (ref 150–400)
RBC: 3.07 MIL/uL — ABNORMAL LOW (ref 3.87–5.11)
RDW: 15.2 % (ref 11.5–15.5)
WBC: 24.8 10*3/uL — ABNORMAL HIGH (ref 4.0–10.5)

## 2013-11-10 LAB — PHOSPHORUS: PHOSPHORUS: 4.5 mg/dL (ref 2.3–4.6)

## 2013-11-10 LAB — MAGNESIUM: Magnesium: 2.4 mg/dL (ref 1.5–2.5)

## 2013-11-10 MED ORDER — VANCOMYCIN HCL IN DEXTROSE 1-5 GM/200ML-% IV SOLN
1000.0000 mg | INTRAVENOUS | Status: DC
  Administered 2013-11-11: 1000 mg via INTRAVENOUS
  Filled 2013-11-10: qty 200

## 2013-11-10 MED ORDER — SODIUM CHLORIDE 0.9 % IV BOLUS (SEPSIS)
1000.0000 mL | Freq: Once | INTRAVENOUS | Status: AC
  Administered 2013-11-10: 1000 mL via INTRAVENOUS

## 2013-11-10 NOTE — Progress Notes (Signed)
Patient has been back on PRVC 30% P:5 R:16 and propofol back up to 49mcg drip while sustaining a HR in 110-120's. PRN Fentanyl and Acetaminophen administered for possible pain and temperature of 100.6. All other VSS. E-link RN called to notify and will inform MD. Will continue to monitor. Richardean Sale, RN

## 2013-11-10 NOTE — Progress Notes (Signed)
RT note- placed back to full support due increased HR.

## 2013-11-10 NOTE — Progress Notes (Signed)
Pt tube retaped at this time due to nasal drainage soaking cloth tape. Resecured at hub with no complications. RT will continue to monitor.

## 2013-11-10 NOTE — Progress Notes (Signed)
Patient will arouse to voice stimulation, will try and open her eyes but does not yet follow commands. RR is 23, HR 108, with accessory muscle breathing

## 2013-11-10 NOTE — Progress Notes (Signed)
PULMONARY / CRITICAL CARE MEDICINE   Name: Shelley Glover MRN: [id number] DOB: Apr 26, 1932    ADMISSION DATE:  11/04/2013  REFERRING MD :  EDP PRIMARY SERVICE: PCCM  CHIEF COMPLAINT:  AMS, resp failure   BRIEF PATIENT DESCRIPTION:  80 yo female had fall w/o LOC on 5/24, seen by EMS and refused ER evaluation.  Found by caregiver 5/25 unresponsive.  In ER was hypothermic, hypoglycemia, dilated pupils, clenched jaw >> required nasal intubation in ED.  Hx of breast cancer s/p XRT with cellulitis and wound vac Rt axilla .  PCCM asked to admit.  SIGNIFICANT EVENTS: 5/25 Admit, neurology consulted  STUDIES:  CT head 5/25>>>no acute abnormality EEG 5/26 >>> limited study, generalized slowing, no seizure activity  LINES / TUBES: ETT 5/25 (nasal) >>> Lt IJ CVL 5/25 >>   CULTURES: BCx2 5/25>>> Urine 5/25>>>negative Sputum 5/25>>>  ANTIBIOTICS: Vanc 5/25>>> unasyn 5/25>>>  SUBJECTIVE:  Tolerating pressure support.  No further fever.  VITAL SIGNS: Temp:  [97.8 F (36.6 C)-101.3 F (38.5 C)] 99.7 F (37.6 C) (05/27 0700) Pulse Rate:  [85-105] 98 (05/27 0700) Resp:  [18-39] 21 (05/27 0700) BP: (81-145)/(32-114) 99/36 mmHg (05/27 0700) SpO2:  [93 %-100 %] 100 % (05/27 0700) FiO2 (%):  [30 %] 30 % (05/27 0400) Weight:  [175 lb 4.3 oz (79.5 kg)] 175 lb 4.3 oz (79.5 kg) (05/27 0428) HEMODYNAMICS: CVP:  [7 mmHg] 7 mmHg VENTILATOR SETTINGS: Vent Mode:  [-] PRVC FiO2 (%):  [30 %] 30 % Set Rate:  [18 bmp-22 bmp] 22 bmp Vt Set:  [460 mL] 460 mL PEEP:  [5 cmH20] 5 cmH20 Plateau Pressure:  [18 cmH20] 18 cmH20 INTAKE / OUTPUT: Intake/Output     05/26 0701 - 05/27 0700 05/27 0701 - 05/28 0700   I.V. (mL/kg) 1281.9 (16.1)    NG/GT 600    IV Piggyback 250    Total Intake(mL/kg) 2131.9 (26.8)    Urine (mL/kg/hr) 905 (0.5)    Emesis/NG output 50 (0)    Total Output 955     Net +1176.9          Stool Occurrence 1 x      PHYSICAL EXAMINATION: General: ill appearing Neuro: RASS  -4 HEENT: pupils reactive, nasal endotracheal tube in place, OG tube in place Cardiovascular: regular Lungs: scattered rhonchi Abdomen:  Soft, non tender Musculoskeletal: 1+ ankle edema, Rt axilla wound vac in place  LABS:  CBC  Recent Labs Lab 11/03/2013 1509 11/09/13 0500 11/10/13 0500  WBC 27.3* 29.5* 24.8*  HGB 11.0* 10.2* 9.7*  HCT 32.9* 29.9* 28.4*  PLT 289 299 285   Coag's  Recent Labs Lab 10/30/2013 0859  INR 1.22   BMET  Recent Labs Lab 11/01/2013 1700 11/09/13 0500 11/10/13 0500  NA 134* 131* 130*  K 3.5* 3.6* 3.8  CL 98 98 95*  CO2 19 17* 18*  BUN 34* 35* 40*  CREATININE 1.85* 2.22* 2.52*  GLUCOSE 64* 148* 303*   Electrolytes  Recent Labs Lab 10/18/2013 1700 11/09/13 0500 11/10/13 0500  CALCIUM 7.5* 7.1* 7.3*  MG  --  0.9* 2.4  PHOS  --  3.6 4.5   Sepsis Markers  Recent Labs Lab 10/16/2013 0859 10/26/2013 1259  LATICACIDVEN 1.6  --   PROCALCITON  --  0.99   ABG  Recent Labs Lab 11/04/2013 0919  PHART 7.285*  PCO2ART 44.9  PO2ART 490.0*   Liver Enzymes  Recent Labs Lab 10/19/2013 0859 11/09/13 1124  AST 21 27  ALT 11 12  ALKPHOS 86 100  BILITOT 0.4 0.4  ALBUMIN 2.5* 2.1*   Cardiac Enzymes  Recent Labs Lab 11/26/2013 1301  TROPONINI <0.30   Glucose  Recent Labs Lab 11/09/13 0807 11/09/13 1210 11/09/13 1539 11/09/13 1934 11/09/13 2335 11/10/13 0344  GLUCAP 125* 156* 217* 265* 266* 249*    Imaging Ct Head Wo Contrast  11/23/2013   CLINICAL DATA:  Unresponsive, fell, dilated pupils.  EXAM: CT HEAD WITHOUT CONTRAST  CT CERVICAL SPINE WITHOUT CONTRAST  TECHNIQUE: Multidetector CT imaging of the head and cervical spine was performed following the standard protocol without intravenous contrast. Multiplanar CT image reconstructions of the cervical spine were also generated.  COMPARISON:  06/08/2012  FINDINGS: CT HEAD FINDINGS  Mucoperiosteal thickening in ethmoid air cells.  There is no evidence of acute intracranial hemorrhage,  brain edema, mass lesion, acute infarction, mass effect, or midline shift. Acute infarct may be inapparent on noncontrast CT. No other intra-axial abnormalities are seen, and the ventricles and sulci are within normal limits in size and symmetry. No abnormal extra-axial fluid collections or masses are identified. No significant calvarial abnormality.  CT CERVICAL SPINE FINDINGS  Normal alignment. Mild narrowing of C5-6 and C6-7 interspaces with endplate spurring. Facet DJD bilaterally C2-3, C3-4, C4-5. No prevertebral soft tissue swelling. Negative for fracture. Asymmetric pleural-based irregular opacity at the in the right lung apex. Endotracheal tube partially seen.  IMPRESSION: 1. Negative for bleed or other acute intracranial abnormality. 2. Negative cervical fracture or other acute bone abnormality. 3. Multilevel cervical degenerative changes as above. 4. Pleural-based right apical lung opacity, incompletely characterized.   Electronically Signed   By: Arne Cleveland M.D.   On: 11/25/2013 10:21   Ct Cervical Spine Wo Contrast  12/08/2013   CLINICAL DATA:  Unresponsive, fell, dilated pupils.  EXAM: CT HEAD WITHOUT CONTRAST  CT CERVICAL SPINE WITHOUT CONTRAST  TECHNIQUE: Multidetector CT imaging of the head and cervical spine was performed following the standard protocol without intravenous contrast. Multiplanar CT image reconstructions of the cervical spine were also generated.  COMPARISON:  06/08/2012  FINDINGS: CT HEAD FINDINGS  Mucoperiosteal thickening in ethmoid air cells.  There is no evidence of acute intracranial hemorrhage, brain edema, mass lesion, acute infarction, mass effect, or midline shift. Acute infarct may be inapparent on noncontrast CT. No other intra-axial abnormalities are seen, and the ventricles and sulci are within normal limits in size and symmetry. No abnormal extra-axial fluid collections or masses are identified. No significant calvarial abnormality.  CT CERVICAL SPINE FINDINGS   Normal alignment. Mild narrowing of C5-6 and C6-7 interspaces with endplate spurring. Facet DJD bilaterally C2-3, C3-4, C4-5. No prevertebral soft tissue swelling. Negative for fracture. Asymmetric pleural-based irregular opacity at the in the right lung apex. Endotracheal tube partially seen.  IMPRESSION: 1. Negative for bleed or other acute intracranial abnormality. 2. Negative cervical fracture or other acute bone abnormality. 3. Multilevel cervical degenerative changes as above. 4. Pleural-based right apical lung opacity, incompletely characterized.   Electronically Signed   By: Arne Cleveland M.D.   On: November 15, 2013 10:21   Dg Chest Port 1 View  11/09/2013   CLINICAL DATA:  Aspiration pneumonia.  EXAM: PORTABLE CHEST - 1 VIEW  COMPARISON:  11/21/2013 and previous  FINDINGS: Endotracheal tube has its tip 4.5 cm above the carina. Left internal jugular central line has its tip in the SVC above the right atrium. Nasogastric tube enters the stomach. Patchy infiltrate in the left lower lobe consistent with aspiration pneumonia. This is somewhat more  appreciable given the radiographic technique. The lung is not densely consolidated or collapsed. No effusion.  IMPRESSION: Patchy infiltrate in left lower lobe consistent with pneumonia, due to aspiration by history. No dense consolidation or major volume loss.   Electronically Signed   By: Nelson Chimes M.D.   On: 11/09/2013 08:11   Dg Chest Port 1 View  10/21/2013   CLINICAL DATA:  Central line placement  EXAM: PORTABLE CHEST - 1 VIEW  COMPARISON:  11/02/2013 at 9:30 a.m.  FINDINGS: Left internal jugular central venous line has its tip in the lower superior vena cava, well positioned. There is no pneumothorax.  Endotracheal tube and nasogastric tube remain stable in well positioned.  No other change from the earlier study.  IMPRESSION: Left internal jugular central venous line is well positioned with its tip in the lower superior vena cava. No pneumothorax.    Electronically Signed   By: Lajean Manes M.D.   On: 10/26/2013 16:42   Dg Chest Portable 1 View  11/06/2013   CLINICAL DATA:  ALTERED MENTAL STATUS ALTERED MENTAL STATUS, intubated  EXAM: PORTABLE CHEST - 1 VIEW  COMPARISON:  09/02/2013  FINDINGS: Endotracheal tube tip is approximately 4 cm above carina. Nasogastric tube extends at least as far as the stomach, tip not seen. Surgical clips in bilateral axilla. Mild cardiomegaly. Relatively low lung volumes with coarse predominantly perihilar interstitial opacities. . No effusion. Visualized skeletal structures are unremarkable.  IMPRESSION: 1. Endotracheal tube and nasogastric tube placement as above. 2. Coarse perihilar infiltrates or edema.   Electronically Signed   By: Arne Cleveland M.D.   On: 11/05/2013 10:35     ASSESSMENT / PLAN:  PULMONARY A: Acute respiratory failure 2nd to acute encephalopathy and aspiration pneumonia. Hx of COPD. P:   Pressure support wean as tolerated >> mental status barrier to extubation F/u CXR PRN BD's  CARDIOVASCULAR A: Hypotension 2nd to hypovolemia and sedation Hx of HTN  P:  Fluid bolus x one on 5/27 Monitor hemodynamics Hold outpt ASA, lipitor, cozaar, propranolol, torsemide  RENAL A: Acute renal failure >> unknown baseline renal fx. Hyponatremia - mild. P:   Continue IV fluids F/u renal fx, urine outpt electrolytes  GASTROINTESTINAL A: Nutrition. Hx of GERD. P:   Tube feeds while on vent Protonix for SUP  HEMATOLOGIC A: Anemia of critical illness and chronic disease. Leukocytosis. P:  F/u cbc  Heparin for DVT proph   INFECTIOUS A: Sepsis 2nd to aspiration PNA. Chronic Rt axillary cellulitis s/p wound vac. P:   Day 3 vancomycin, unasyn Wound care to follow  ENDOCRINE A: Hypoglycemia, hypothermia on admission >> resolved. Hx of DM. P:   SSI Hold outpt glucotrol  NEUROLOGIC A: Acute encephalopathy 2nd to sepsis, AKI, respiratory failure, hypoglycemia,  hypothermia +/- seizure. P:   Will need MRI >> defer timing to neurology RASS goal 0  CC time 35 minutes.  Chesley Mires, MD [facility] Pulmonary/Critical Care 11/10/2013, 7:37 AM Pager:  [phone] After 3pm call: [phone]

## 2013-11-10 NOTE — Progress Notes (Signed)
Inpatient Diabetes Program Recommendations  AACE/ADA: New Consensus Statement on Inpatient Glycemic Control (2013)  Target Ranges:  Prepandial:   less than 140 mg/dL      Peak postprandial:   less than 180 mg/dL (1-2 hours)      Critically ill patients:  140 - 180 mg/dL   Reason for Assessment:  Results for VISTA, [id number] (MRN [id number]) as of 11/10/2013 12:39  Ref. Range 11/10/2013 03:44 11/10/2013 05:00 11/10/2013 06:34 11/10/2013 07:45 11/10/2013 11:45  Glucose-Capillary Latest Range: 70-99 mg/dL 249 (H)   307 (H) 294 (H)   Diabetes history: Diabetes Type 2 Outpatient Diabetes medications: Metformin 1000 mg with breakfast, Glipizide 10 mg XL daily Current orders for Inpatient glycemic control: Novolog sensitive q 4 hours  Note that initially patient was admitted with hypoglycemia and had to be on D10.  D10 has been stopped and patient is now on tube feeds continuous (Vital 60 cc/hr).  May consider IV insulin/ICU protocol phase 2.  If IV insulin not started, consider adding Novolog tube feed coverage 2 units q 4 hours (to cover CHO in tube feed).    Thanks, Adah Perl, RN, BC-ADM Inpatient Diabetes Coordinator Pager [phone]

## 2013-11-10 NOTE — Progress Notes (Signed)
ANTIBIOTIC CONSULT NOTE - FOLLOW UP  Pharmacy Consult for vancomycin, Unasyn Indication: cellulitis  No Known Allergies  Patient Measurements: Height: 5\' 5"  (165.1 cm) Weight: 175 lb 4.3 oz (79.5 kg) IBW/kg (Calculated) : 57 Vital Signs: Temp: 99.7 F (37.6 C) (05/27 0700) Temp src: Core (Comment) (05/27 0400) BP: 99/36 mmHg (05/27 0700) Pulse Rate: 98 (05/27 0700) Intake/Output from previous day: 05/26 0701 - 05/27 0700 In: 2131.9 [I.V.:1281.9; NG/GT:600; IV Piggyback:250] Out: 955 [Urine:905; Emesis/NG output:50] Intake/Output from this shift: Total I/O In: 100 [I.V.:10; NG/GT:90] Out: 30 [Urine:30]  Labs:  Recent Labs  2013/11/30 1509 Nov 30, 2013 1620 2013-11-30 1700 11/09/13 0500 11/10/13 0500  WBC 27.3*  --   --  29.5* 24.8*  HGB 11.0*  --   --  10.2* 9.7*  PLT 289  --   --  299 285  LABCREA  --  77.47  --   --   --   CREATININE 2.03*  --  1.85* 2.22* 2.52*   Estimated Creatinine Clearance: 18.6 ml/min (by C-G formula based on Cr of 2.52). No results found for this basename: Letta Median, VANCORANDOM, GENTTROUGH, GENTPEAK, GENTRANDOM, TOBRATROUGH, TOBRAPEAK, TOBRARND, AMIKACINPEAK, AMIKACINTROU, AMIKACIN,  in the last 72 hours   Microbiology: Recent Results (from the past 720 hour(s))  URINE CULTURE     Status: None   Collection Time    2013-11-30 11:55 AM      Result Value Ref Range Status   Specimen Description URINE, CATHETERIZED   Final   Special Requests NONE   Final   Culture  Setup Time     Final   Value: 11/30/2013 16:50     Performed at [facility]     Final   Value: NO GROWTH     Performed at [facility]   Culture     Final   Value: NO GROWTH     Performed at [facility]   Report Status 30-Nov-2013 FINAL   Final  MRSA PCR SCREENING     Status: None   Collection Time    11/30/2013  2:48 PM      Result Value Ref Range Status   MRSA by PCR NEGATIVE  NEGATIVE Final   Comment:            The  GeneXpert MRSA Assay (FDA     approved for NASAL specimens     only), is one component of a     comprehensive MRSA colonization     surveillance program. It is not     intended to diagnose MRSA     infection nor to guide or     monitor treatment for     MRSA infections.  CULTURE, BLOOD (ROUTINE X 2)     Status: None   Collection Time    11/30/2013  3:09 PM      Result Value Ref Range Status   Specimen Description BLOOD LEFT ANTECUBITAL   Final   Special Requests BOTTLES DRAWN AEROBIC ONLY 4CC   Final   Culture  Setup Time     Final   Value: 11-30-2013 22:41     Performed at [facility]   Culture     Final   Value:        BLOOD CULTURE RECEIVED NO GROWTH TO DATE CULTURE WILL BE HELD FOR 5 DAYS BEFORE ISSUING A FINAL NEGATIVE REPORT     Performed at [facility]   Report Status PENDING  Incomplete  CULTURE, RESPIRATORY (NON-EXPECTORATED)     Status: None   Collection Time    11/10/2013  3:13 PM      Result Value Ref Range Status   Specimen Description TRACHEAL ASPIRATE   Final   Special Requests NONE   Final   Gram Stain     Final   Value: FEW WBC PRESENT,BOTH PMN AND MONONUCLEAR     RARE SQUAMOUS EPITHELIAL CELLS PRESENT     RARE GRAM POSITIVE COCCI     IN PAIRS RARE GRAM POSITIVE RODS     Performed at [facility]   Culture     Final   Value: Non-Pathogenic Oropharyngeal-type Flora Isolated.     Performed at [facility]   Report Status PENDING   Incomplete  CULTURE, BLOOD (ROUTINE X 2)     Status: None   Collection Time    11/04/2013  3:25 PM      Result Value Ref Range Status   Specimen Description BLOOD LEFT HAND   Final   Special Requests BOTTLES DRAWN AEROBIC ONLY 2CC   Final   Culture  Setup Time     Final   Value: 10/28/2013 22:40     Performed at [facility]   Culture     Final   Value:        BLOOD CULTURE RECEIVED NO GROWTH TO DATE CULTURE WILL BE HELD FOR 5 DAYS BEFORE ISSUING A FINAL NEGATIVE REPORT     Performed at  [facility]   Report Status PENDING   Incomplete    Anti-infectives   Start     Dose/Rate Route Frequency Ordered Stop   11/11/13 1500  vancomycin (VANCOCIN) IVPB 1000 mg/200 mL premix     1,000 mg 200 mL/hr over 60 Minutes Intravenous Every 48 hours 11/10/13 1057     11/09/13 1500  vancomycin (VANCOCIN) IVPB 750 mg/150 ml premix  Status:  Discontinued     750 mg 150 mL/hr over 60 Minutes Intravenous Every 24 hours 10/25/2013 1352 11/10/13 1057   11/01/2013 1430  vancomycin (VANCOCIN) 1,250 mg in sodium chloride 0.9 % 250 mL IVPB     1,250 mg 166.7 mL/hr over 90 Minutes Intravenous  Once 10/22/2013 1351 10/27/2013 1924   11/09/2013 1430  ampicillin-sulbactam (UNASYN) 1.5 g in sodium chloride 0.9 % 50 mL IVPB     1.5 g 100 mL/hr over 30 Minutes Intravenous Every 12 hours 10/21/2013 1351     11/01/2013 1200  cefTRIAXone (ROCEPHIN) 1 g in dextrose 5 % 50 mL IVPB     1 g 100 mL/hr over 30 Minutes Intravenous  Once 11/06/2013 1148 10/22/2013 1259   10/20/2013 1200  azithromycin (ZITHROMAX) 500 mg in dextrose 5 % 250 mL IVPB     500 mg 250 mL/hr over 60 Minutes Intravenous  Once 10/17/2013 1148 10/25/2013 1404      Assessment: 82 YOF on vancomycin day # 3 for cellulitis s/p wound vac. Also on Levaquin for r/o aspiration pneumonia. SCr is rising with estimated CrCl~ 15-62mL/min.   5/25 Bld >> ngtd 5/25 Urine -neg 5/25 Sputum -NPF  Goal of Therapy:  Vancomycin trough level 10-15 mcg/ml  Plan:  Change vancomycin to 1g IV q48h.  Continue Unasyn at current dose.  Follow-up culture data, renal function, and clinical status VT at steady state if appropriate.   Sloan Leiter, PharmD, BCPS Clinical Pharmacist [phone] 11/10/2013,10:59 AM

## 2013-11-10 NOTE — Progress Notes (Signed)
Subjective: No adverse overnight events reported. Patient is showing no improvement in level of consciousness, including not waking up with holding propofol IV. Repeat EEG as well as MRI of the brain are pending.  Objective: Current vital signs: BP 99/36  Pulse 98  Temp(Src) 99.7 F (37.6 C) (Core (Comment))  Resp 21  Ht 5\' 5"  (1.651 m)  Wt 79.5 kg (175 lb 4.3 oz)  BMI 29.17 kg/m2  SpO2 100%  Neurologic Exam: Respirations were normal and regular. Propofol was being held. Patient responded with slight eye-opening bilaterally. There was no indication of visual fixation, and no reaction to visual threat. Pupils were equal and reacted normally to light. Extraocular movements were full and conjugate. No facial weakness was noted. Muscle tone was flaccid throughout. There was no abnormal posturing of extremities. Deep tendon reflexes were 1+ in upper extremities and trace only at the knees. Plantar responses were extensor bilaterally.  Medications: I have reviewed the patient's current medications.  Assessment/Plan: 80 year old lady with severe encephalopathy, likely toxic/metabolic in etiology. She's had no recurrence of movement of left foot following IV loading dose of Keppra. Focal seizure activity suspected. Repeat EEG and MRI are pending.  No changes recommended in current management. We will continue to follow this patient with you.  C.R. Nicole Kindred, MD [facility] [phone]  11/10/2013  1:48 PM

## 2013-11-10 NOTE — Progress Notes (Signed)
Patient HR steadily rising and occasionally hit 120 ST. Patient appeared to be working hard. Put back onto PRVC, diprivan restarted at 16mcg.

## 2013-11-10 NOTE — Progress Notes (Signed)
Pt transported back from MRI at this time with no complications. RT will continue to monitor.

## 2013-11-11 ENCOUNTER — Inpatient Hospital Stay (HOSPITAL_COMMUNITY): Payer: Medicare Other

## 2013-11-11 DIAGNOSIS — I10 Essential (primary) hypertension: Secondary | ICD-10-CM

## 2013-11-11 DIAGNOSIS — Z853 Personal history of malignant neoplasm of breast: Secondary | ICD-10-CM

## 2013-11-11 DIAGNOSIS — E119 Type 2 diabetes mellitus without complications: Secondary | ICD-10-CM

## 2013-11-11 DIAGNOSIS — R4020 Unspecified coma: Secondary | ICD-10-CM

## 2013-11-11 LAB — BASIC METABOLIC PANEL
BUN: 50 mg/dL — ABNORMAL HIGH (ref 6–23)
CHLORIDE: 101 meq/L (ref 96–112)
CO2: 18 meq/L — AB (ref 19–32)
CREATININE: 2.36 mg/dL — AB (ref 0.50–1.10)
Calcium: 7.5 mg/dL — ABNORMAL LOW (ref 8.4–10.5)
GFR calc non Af Amer: 18 mL/min — ABNORMAL LOW (ref >90)
GFR, EST AFRICAN AMERICAN: 21 mL/min — AB (ref >90)
Glucose, Bld: 288 mg/dL — ABNORMAL HIGH (ref 70–99)
Potassium: 3.2 mEq/L — ABNORMAL LOW (ref 3.7–5.3)
Sodium: 136 mEq/L — ABNORMAL LOW (ref 137–147)

## 2013-11-11 LAB — GLUCOSE, CAPILLARY
GLUCOSE-CAPILLARY: 247 mg/dL — AB (ref 70–99)
GLUCOSE-CAPILLARY: 339 mg/dL — AB (ref 70–99)
GLUCOSE-CAPILLARY: 353 mg/dL — AB (ref 70–99)
Glucose-Capillary: 257 mg/dL — ABNORMAL HIGH (ref 70–99)
Glucose-Capillary: 255 mg/dL — ABNORMAL HIGH (ref 70–99)
Glucose-Capillary: 325 mg/dL — ABNORMAL HIGH (ref 70–99)
Glucose-Capillary: 319 mg/dL — ABNORMAL HIGH (ref 70–99)

## 2013-11-11 LAB — CBC
HEMATOCRIT: 28.3 % — AB (ref 36.0–46.0)
Hemoglobin: 9.5 g/dL — ABNORMAL LOW (ref 12.0–15.0)
MCH: 31.4 pg (ref 26.0–34.0)
MCHC: 33.6 g/dL (ref 30.0–36.0)
MCV: 93.4 fL (ref 78.0–100.0)
Platelets: 301 10*3/uL (ref 150–400)
RBC: 3.03 MIL/uL — ABNORMAL LOW (ref 3.87–5.11)
RDW: 15.4 % (ref 11.5–15.5)
WBC: 16.1 10*3/uL — AB (ref 4.0–10.5)

## 2013-11-11 LAB — CULTURE, RESPIRATORY

## 2013-11-11 LAB — CULTURE, RESPIRATORY W GRAM STAIN

## 2013-11-11 MED ORDER — ASPIRIN 81 MG PO CHEW
81.0000 mg | CHEWABLE_TABLET | Freq: Every day | ORAL | Status: DC
  Administered 2013-11-11 – 2013-11-14 (×4): 81 mg via ORAL
  Filled 2013-11-11 (×4): qty 1

## 2013-11-11 MED ORDER — INSULIN GLARGINE 100 UNIT/ML ~~LOC~~ SOLN
10.0000 [IU] | Freq: Every day | SUBCUTANEOUS | Status: DC
  Administered 2013-11-11 – 2013-11-12 (×2): 10 [IU] via SUBCUTANEOUS
  Filled 2013-11-11 (×2): qty 0.1

## 2013-11-11 MED ORDER — FENTANYL CITRATE 0.05 MG/ML IJ SOLN
50.0000 ug | INTRAMUSCULAR | Status: DC | PRN
  Administered 2013-11-11 – 2013-11-13 (×10): 50 ug via INTRAVENOUS
  Filled 2013-11-11 (×10): qty 2

## 2013-11-11 MED ORDER — ATORVASTATIN CALCIUM 10 MG PO TABS
10.0000 mg | ORAL_TABLET | Freq: Every day | ORAL | Status: DC
  Administered 2013-11-11 – 2013-11-14 (×4): 10 mg via ORAL
  Filled 2013-11-11 (×4): qty 1

## 2013-11-11 MED ORDER — POTASSIUM CHLORIDE 20 MEQ/15ML (10%) PO LIQD
40.0000 meq | Freq: Once | ORAL | Status: AC
  Administered 2013-11-11: 40 meq
  Filled 2013-11-11 (×2): qty 30

## 2013-11-11 NOTE — Progress Notes (Signed)
Subjective: Patient remains intubated and on ventilatory support. She has intermittently required sedation with propofol. No indication of regaining of consciousness reported.  Objective: Current vital signs: BP 138/55  Pulse 112  Temp(Src) 99.8 F (37.7 C) (Core (Comment))  Resp 27  Ht 5\' 5"  (1.651 m)  Wt 80.6 kg (177 lb 11.1 oz)  BMI 29.57 kg/m2  SpO2 99%  Neurologic Exam: Intubated and on mechanical ventilation with no spontaneous respirations noted. She's currently on propofol 10 mcg per kilogram per minute. Patient was unresponsive except for minimal withdrawal movements of the lower extremities to noxious stimuli. Pupils were equal and reacted normally to light. Extraocular movements were intact but sluggish with oculocephalic maneuvers. No focal facial weakness noted. Muscle tone was flaccid throughout. Patient had no abnormal posturing of extremities. Contours plantars were extensor bilaterally.  MRI of brain on 11/10/2013 showed no acute intracranial abnormality. Advanced white matter disease was noted and unchanged from previous study.  EEG today showed moderate generalized nonspecific continuous slowing of cerebral activity, but was otherwise unremarkable. No evidence of epileptic activity was seen.  Medications: I have reviewed the patient's current medications.  Assessment/Plan: Severe encephalopathic state, likely secondary to toxic/metabolic etiologies, with no signs of improvement. No recurrent focal seizure activity reported. MRI showed no signs of acute intracranial abnormality including no signs of acute stroke. There were also no indications of increased intracranial pressure. EEG showed moderate nonspecific slowing with no seizure activity recorded.  Recommend no changes in current management at this time.  C.R. Nicole Kindred, MD [facility] [phone]  11/11/2013  3:44 PM

## 2013-11-11 NOTE — Progress Notes (Signed)
RT note- Patient transferred to 2100 on ventilator and report given.

## 2013-11-11 NOTE — Consult Note (Addendum)
OC wound consult note Pt was on a Vac at home before admission according to progress notes.  She has been placed on the hospital Vac machine at this time.  She is unresponsive and did not appear to be in pain; no family present in room.  Wound appearance unchanged from previous assessment.  Wound type: Full thickness wound to right axilla Small amt pink-yellow drainage in cannister. Periwound: Edges of wound intact without further maceration. Dressing procedure/placement/frequency: Applied one piece black foam to 170mm cont suction and bridged track pad with another piece of foam away from axilla area to avoid pressure. Bedside nurse can change dressing on Tues/Thurs/Sat Julien Girt MSN, RN, [place], [place], [place]

## 2013-11-11 NOTE — Progress Notes (Signed)
EEG completed; results pending.    

## 2013-11-11 NOTE — Procedures (Signed)
ELECTROENCEPHALOGRAM REPORT  Patient: Shelley Glover       Room #: 9I33 EEG No. ID: [id number] Age: 80 y.o.        Sex: female Referring Physician: Elsworth Soho Report Date:  11/11/2013        Interpreting Physician: Wallie Char  History: Therma Lasure is an 80 y.o. female brought to the emergency room after being found unresponsive, hypothermic, hypoglycemic and dilated pupils. Patient was intubated in the emergency room and has remained on ventilatory support and has required sedation intermittently. MRI studies on 11/10/2013 and 11/11/2013 shown no acute intracranial abnormality.  Indications for study:  Assess severity of encephalopathy; rule out seizure activity  Technique: This is an 18 channel routine scalp EEG performed at the bedside with bipolar and monopolar montages arranged in accordance to the international 10/20 system of electrode placement.   Description: Patient is intubated and on mechanical ventilation at the time of this study. In the background activity consisted of low to slightly moderate amplitude diffuse continuous irregular delta and theta activity. There was superimposed faster low amplitude activity in the 11-13 Hz range recorded from the central head region. Photic stimulation was not performed. No epileptiform discharges were recorded.  Interpretation: This EEG is abnormal with moderately severe generalized continuous nonspecific slowing of cerebral activity. No evidence of an epileptic disorder was demonstrated.   Rush Farmer M.D. [facility] [phone]

## 2013-11-11 NOTE — Progress Notes (Signed)
PULMONARY / CRITICAL CARE MEDICINE   Name: Shelley Glover MRN: [id number] DOB: [birth year]    ADMISSION DATE:  11/12/2013  REFERRING MD :  EDP PRIMARY SERVICE: PCCM  CHIEF COMPLAINT:  AMS, resp failure   BRIEF PATIENT DESCRIPTION:  80 yo female had fall w/o LOC on 5/24, seen by EMS and refused ER evaluation.  Found by caregiver 5/25 unresponsive.  In ER was hypothermic, hypoglycemia, dilated pupils, clenched jaw >> required nasal intubation in ED.  Hx of breast cancer s/p XRT with cellulitis and wound vac Rt axilla .  PCCM asked to admit.  SIGNIFICANT EVENTS: 5/25 Admit, neurology consulted  STUDIES:  CT head 5/25 >>> no acute abnormality EEG 5/26 >>> limited study, generalized slowing, no seizure activity MRI 5/27 >>> advanced white matter disease  LINES / TUBES: ETT 5/25 (nasal) >>> Lt IJ CVL 5/25 >>   CULTURES: BCx2 5/25>>> Urine 5/25>>>negative Sputum 5/25>>>oral flora  ANTIBIOTICS: Vanc 5/25>>> unasyn 5/25>>>  SUBJECTIVE:  Open's eyes intermittently.  VITAL SIGNS: Temp:  [97.9 F (36.6 C)-100.8 F (38.2 C)] 98.7 F (37.1 C) (05/28 0805) Pulse Rate:  [93-120] 98 (05/28 0700) Resp:  [14-25] 23 (05/28 0700) BP: (95-153)/(34-56) 134/46 mmHg (05/28 0700) SpO2:  [96 %-100 %] 99 % (05/28 0700) FiO2 (%):  [30 %] 30 % (05/28 0417) Weight:  [177 lb 11.1 oz (80.6 kg)] 177 lb 11.1 oz (80.6 kg) (05/28 0500) VENTILATOR SETTINGS: Vent Mode:  [-] PRVC FiO2 (%):  [30 %] 30 % Set Rate:  [16 bmp] 16 bmp Vt Set:  [460 mL] 460 mL PEEP:  [5 cmH20] 5 cmH20 Pressure Support:  [10 cmH20] 10 cmH20 Plateau Pressure:  [18 cmH20-22 cmH20] 19 cmH20 INTAKE / OUTPUT: Intake/Output     05/27 0701 - 05/28 0700 05/28 0701 - 05/29 0700   I.V. (mL/kg) 512.4 (6.4)    NG/GT 1830    IV Piggyback 100    Total Intake(mL/kg) 2442.4 (30.3)    Urine (mL/kg/hr) 1360 (0.7)    Emesis/NG output     Drains 50 (0)    Stool 200 (0.1)    Total Output 1610     Net +832.4          Stool Occurrence  1 x      PHYSICAL EXAMINATION: General: ill appearing Neuro: comatose HEENT: pupils reactive, nasal endotracheal tube in place, OG tube in place Cardiovascular: regular Lungs: scattered rhonchi Abdomen:  Soft, non tender Musculoskeletal: 1+ ankle edema, Rt axilla wound vac in place  LABS:  CBC  Recent Labs Lab 11/09/13 0500 11/10/13 0500 11/11/13 0427  WBC 29.5* 24.8* 16.1*  HGB 10.2* 9.7* 9.5*  HCT 29.9* 28.4* 28.3*  PLT 299 285 301   Coag's  Recent Labs Lab 10/29/2013 0859  INR 1.22   BMET  Recent Labs Lab 11/09/13 0500 11/10/13 0500 11/11/13 0427  NA 131* 130* 136*  K 3.6* 3.8 3.2*  CL 98 95* 101  CO2 17* 18* 18*  BUN 35* 40* 50*  CREATININE 2.22* 2.52* 2.36*  GLUCOSE 148* 303* 288*   Electrolytes  Recent Labs Lab 11/09/13 0500 11/10/13 0500 11/11/13 0427  CALCIUM 7.1* 7.3* 7.5*  MG 0.9* 2.4  --   PHOS 3.6 4.5  --    Sepsis Markers  Recent Labs Lab 10/30/2013 0859 10/24/2013 1259  LATICACIDVEN 1.6  --   PROCALCITON  --  0.99   ABG  Recent Labs Lab 11/04/2013 0919  PHART 7.285*  PCO2ART 44.9  PO2ART 490.0*   Liver Enzymes  Recent Labs Lab 10/20/2013 0859 11/09/13 1124  AST 21 27  ALT 11 12  ALKPHOS 86 100  BILITOT 0.4 0.4  ALBUMIN 2.5* 2.1*   Cardiac Enzymes  Recent Labs Lab 11/09/2013 1301  TROPONINI <0.30   Glucose  Recent Labs Lab 11/10/13 1145 11/10/13 1555 11/10/13 1934 11/11/13 0001 11/11/13 0342 11/11/13 0803  GLUCAP 294* 221* 259* 247* 257* 255*    Imaging Mr Brain Wo Contrast  11/10/2013   CLINICAL DATA:  Altered mental status. The patient is unresponsive. Patient is on a ventilator with nasal intubation. The examination had to be discontinued prior to completion due to unstable airway.  EXAM: MRI HEAD WITHOUT CONTRAST  TECHNIQUE: Multiplanar, multiecho pulse sequences of the brain and surrounding structures were obtained without intravenous contrast.  COMPARISON:  CT head and cervical spine from the same  day.  FINDINGS: The diffusion-weighted images demonstrate no evidence for acute or subacute infarction.  No hemorrhage or mass lesion is present. Moderate periventricular and subcortical white matter changes are present bilaterally. Abnormal FLAIR signal within the subarachnoid spaces on the FLAIR sequences likely related to ventilation on oxygen tension.  Flow is present in the major intracranial arteries. The patient is status post left lens replacement. Mild mucosal thickening is present in the anterior ethmoid air cells and frontal sinuses bilaterally. There is a fluid level in the right sphenoid sinus. Minimal prior sub minimal fluid in the mastoid air cells may be related to nasal intubation. There is fluid in the nasopharynx.  IMPRESSION: 1. No evidence for acute or subacute infarction. 2. Age advanced white matter disease. This is nonspecific, but likely reflects the sequela of chronic microvascular ischemia. 3. Sinus disease is likely related to nasal intubation.   Electronically Signed   By: Lawrence Santiago M.D.   On: 11/10/2013 20:01   Dg Chest Port 1 View  11/11/2013   CLINICAL DATA:  Follow-up pneumonia  EXAM: PORTABLE CHEST - 1 VIEW  COMPARISON:  11/10/2013, 06/08/2012  FINDINGS: There is bilateral interstitial thickening which is similar to the prior exam. There is no pleural effusion or pneumothorax. Stable cardiomediastinal silhouette. Bilateral axillary surgical clips likely from prior axillary dissection. Unremarkable osseous structures.  IMPRESSION: Bilateral diffuse interstitial thickening likely reflecting chronic bronchitic changes. Mild superimposed interstitial edema or pneumonitis cannot be excluded.   Electronically Signed   By: Kathreen Devoid   On: 11/11/2013 08:05   Dg Chest Port 1 View  11/10/2013   CLINICAL DATA:  Follow-up pneumonia.  EXAM: PORTABLE CHEST - 1 VIEW  COMPARISON:  11/09/2013  FINDINGS: Endotracheal tube remains in place with tip at the level of the clavicular heads,  well above the carina. Enteric tube remains in place with with tip in the region of the gastric body and side hole likely just beyond the GE junction. Left jugular central venous catheter remains with tip overlying the upper SVC.  Cardiomediastinal silhouette is within normal limits. Surgical clips are again seen in both axillae. Patchy opacity in the left lower lobe is unchanged. Mildly prominent interstitial markings throughout the right lung are unchanged. There is no evidence of new airspace consolidation. No definite pleural effusion or pneumothorax is identified.  IMPRESSION: 1. Unchanged left lower lobe infiltrate consistent with pneumonia. 2. Unchanged appearance of support apparatus.   Electronically Signed   By: Logan Bores   On: 11/10/2013 08:06     ASSESSMENT / PLAN:  PULMONARY A: Acute respiratory failure 2nd to acute encephalopathy and aspiration pneumonia. Hx of COPD.  P:   Pressure support wean as tolerated >> mental status barrier to extubation F/u CXR PRN BD's  CARDIOVASCULAR A: Hypotension 2nd to hypovolemia and sedation >> resolved. Hx of HTN  P:  Monitor hemodynamics Restart ASA, lipitor 5/28 Hold outpt cozaar, propranolol, torsemide  RENAL A: Acute renal failure >> unknown baseline renal fx. Hyponatremia - mild >> improved. Hypokalemia. P:   F/u renal fx, urine outpt electrolytes  GASTROINTESTINAL A: Nutrition. Hx of GERD. P:   Tube feeds while on vent Protonix for SUP  HEMATOLOGIC A: Anemia of critical illness and chronic disease. Leukocytosis. P:  F/u CBC  Heparin for DVT proph   INFECTIOUS A: Sepsis 2nd to aspiration PNA. Chronic Rt axillary cellulitis s/p wound vac. P:   Day 4 vancomycin, unasyn Wound care to follow  ENDOCRINE A: Hypoglycemia, hypothermia on admission >> resolved. Hx of DM. P:   SSI Add lantus 10 units on 5/28 Hold outpt glucotrol  NEUROLOGIC A: Acute encephalopathy 2nd to sepsis, AKI, respiratory failure,  hypoglycemia, hypothermia +/- seizure. P:   RASS goal 0 >> change to PAD phase 1 on 5/28 F/u EEG  Updated family at bedside.  Explained main concern is lack of improvement in mental status, and that she may remain in vegetative state.  Neurology to assist with long term prognostication.  CC time 35 minutes.  Chesley Mires, MD [facility] Pulmonary/Critical Care 11/11/2013, 9:02 AM Pager:  [phone] After 3pm call: [phone]

## 2013-11-12 ENCOUNTER — Inpatient Hospital Stay (HOSPITAL_COMMUNITY): Payer: Medicare Other

## 2013-11-12 LAB — CBC
HCT: 29.8 % — ABNORMAL LOW (ref 36.0–46.0)
Hemoglobin: 10 g/dL — ABNORMAL LOW (ref 12.0–15.0)
MCH: 31.7 pg (ref 26.0–34.0)
MCHC: 33.6 g/dL (ref 30.0–36.0)
MCV: 94.6 fL (ref 78.0–100.0)
Platelets: 340 10*3/uL (ref 150–400)
RBC: 3.15 MIL/uL — ABNORMAL LOW (ref 3.87–5.11)
RDW: 15.8 % — ABNORMAL HIGH (ref 11.5–15.5)
WBC: 16.5 10*3/uL — AB (ref 4.0–10.5)

## 2013-11-12 LAB — GLUCOSE, CAPILLARY
GLUCOSE-CAPILLARY: 356 mg/dL — AB (ref 70–99)
Glucose-Capillary: 324 mg/dL — ABNORMAL HIGH (ref 70–99)
Glucose-Capillary: 325 mg/dL — ABNORMAL HIGH (ref 70–99)
Glucose-Capillary: 328 mg/dL — ABNORMAL HIGH (ref 70–99)
Glucose-Capillary: 340 mg/dL — ABNORMAL HIGH (ref 70–99)
Glucose-Capillary: 363 mg/dL — ABNORMAL HIGH (ref 70–99)

## 2013-11-12 LAB — BASIC METABOLIC PANEL
BUN: 65 mg/dL — ABNORMAL HIGH (ref 6–23)
CALCIUM: 8.4 mg/dL (ref 8.4–10.5)
CO2: 20 mEq/L (ref 19–32)
CREATININE: 2.07 mg/dL — AB (ref 0.50–1.10)
Chloride: 103 mEq/L (ref 96–112)
GFR calc Af Amer: 25 mL/min — ABNORMAL LOW (ref >90)
GFR calc non Af Amer: 21 mL/min — ABNORMAL LOW (ref >90)
Glucose, Bld: 382 mg/dL — ABNORMAL HIGH (ref 70–99)
Potassium: 4.3 mEq/L (ref 3.7–5.3)
SODIUM: 138 meq/L (ref 137–147)

## 2013-11-12 MED ORDER — INSULIN GLARGINE 100 UNIT/ML ~~LOC~~ SOLN
15.0000 [IU] | Freq: Two times a day (BID) | SUBCUTANEOUS | Status: DC
  Filled 2013-11-12: qty 0.15

## 2013-11-12 MED ORDER — INSULIN GLARGINE 100 UNIT/ML ~~LOC~~ SOLN
10.0000 [IU] | SUBCUTANEOUS | Status: AC
  Administered 2013-11-12: 10 [IU] via SUBCUTANEOUS
  Filled 2013-11-12: qty 0.1

## 2013-11-12 MED ORDER — VANCOMYCIN HCL IN DEXTROSE 1-5 GM/200ML-% IV SOLN
1000.0000 mg | INTRAVENOUS | Status: DC
  Filled 2013-11-12: qty 200

## 2013-11-12 MED ORDER — IPRATROPIUM-ALBUTEROL 0.5-2.5 (3) MG/3ML IN SOLN
3.0000 mL | Freq: Four times a day (QID) | RESPIRATORY_TRACT | Status: DC
  Administered 2013-11-12 – 2013-11-14 (×8): 3 mL via RESPIRATORY_TRACT
  Filled 2013-11-12 (×8): qty 3

## 2013-11-12 MED ORDER — INSULIN GLARGINE 100 UNIT/ML ~~LOC~~ SOLN: 20.0000 [IU] | Freq: Every day | SUBCUTANEOUS | Status: DC

## 2013-11-12 MED ORDER — WHITE PETROLATUM GEL
Status: AC
  Administered 2013-11-12: 0.2
  Filled 2013-11-12: qty 5

## 2013-11-12 NOTE — Progress Notes (Signed)
Results for DRAKE, LANDING (MRN [id number]) as of 11/12/2013 09:51  Ref. Range 11/11/2013 15:46 11/11/2013 20:18 11/11/2013 23:56 11/12/2013 04:28 11/12/2013 07:36  Glucose-Capillary Latest Range: 70-99 mg/dL 339 (H) 353 (H) 328 (H) 363 (H) 356 (H)   CBGs greater than 180 mg/dl.  On continuous tube feedings. Recommend increasing Lantus to 20 units daily. Continue Novolog correction scale every 4 hours. If CBGs continue to be elevated, may need to add Novolog tube feed coverage every 4 hours.  Will follow.  Harvel Ricks RN BSN CDE

## 2013-11-12 NOTE — Progress Notes (Signed)
ANTIBIOTIC CONSULT NOTE - FOLLOW UP  Pharmacy Consult:  Vancomycin / Unasyn Indication:  Sepsis secondary to aspiration PNA + right axillary cellulitis  No Known Allergies  Patient Measurements: Height: 5\' 5"  (165.1 cm) Weight: 180 lb 8.9 oz (81.9 kg) IBW/kg (Calculated) : 57  Vital Signs: Temp: 99.2 F (37.3 C) (05/29 0700) BP: 145/51 mmHg (05/29 0700) Pulse Rate: 122 (05/29 0700) Intake/Output from previous day: 05/28 0701 - 05/29 0700 In: 2115 [I.V.:215; NG/GT:1650; IV Piggyback:250] Out: 2505 [Urine:2280; Stool:225]  Labs:  Recent Labs  11/10/13 0500 11/11/13 0427 11/12/13 0500  WBC 24.8* 16.1* 16.5*  HGB 9.7* 9.5* 10.0*  PLT 285 301 340  CREATININE 2.52* 2.36* 2.07*   Estimated Creatinine Clearance: 22.9 ml/min (by C-G formula based on Cr of 2.07). No results found for this basename: Letta Median, VANCORANDOM, GENTTROUGH, GENTPEAK, GENTRANDOM, TOBRATROUGH, TOBRAPEAK, TOBRARND, AMIKACINPEAK, AMIKACINTROU, AMIKACIN,  in the last 72 hours   Microbiology: Recent Results (from the past 720 hour(s))  URINE CULTURE     Status: None   Collection Time    11/14/2013 11:55 AM      Result Value Ref Range Status   Specimen Description URINE, CATHETERIZED   Final   Special Requests NONE   Final   Culture  Setup Time     Final   Value: 10/19/2013 16:50     Performed at [place]     Final   Value: NO GROWTH     Performed at [facility]   Culture     Final   Value: NO GROWTH     Performed at [facility]   Report Status 11/13/2013 FINAL   Final  MRSA PCR SCREENING     Status: None   Collection Time    10/22/2013  2:48 PM      Result Value Ref Range Status   MRSA by PCR NEGATIVE  NEGATIVE Final   Comment:            The GeneXpert MRSA Assay (FDA     approved for NASAL specimens     only), is one component of a     comprehensive MRSA colonization     surveillance program. It is not     intended to diagnose MRSA   infection nor to guide or     monitor treatment for     MRSA infections.  CULTURE, BLOOD (ROUTINE X 2)     Status: None   Collection Time    11/10/2013  3:09 PM      Result Value Ref Range Status   Specimen Description BLOOD LEFT ANTECUBITAL   Final   Special Requests BOTTLES DRAWN AEROBIC ONLY 4CC   Final   Culture  Setup Time     Final   Value: 10/15/2013 22:41     Performed at [facility]   Culture     Final   Value:        BLOOD CULTURE RECEIVED NO GROWTH TO DATE CULTURE WILL BE HELD FOR 5 DAYS BEFORE ISSUING A FINAL NEGATIVE REPORT     Performed at [facility]   Report Status PENDING   Incomplete  CULTURE, RESPIRATORY (NON-EXPECTORATED)     Status: None   Collection Time    11/10/2013  3:13 PM      Result Value Ref Range Status   Specimen Description TRACHEAL ASPIRATE   Final   Special Requests NONE   Final   Gram Stain  Final   Value: FEW WBC PRESENT,BOTH PMN AND MONONUCLEAR     RARE SQUAMOUS EPITHELIAL CELLS PRESENT     RARE GRAM POSITIVE COCCI     IN PAIRS RARE GRAM POSITIVE RODS     Performed at [facility]   Culture     Final   Value: Non-Pathogenic Oropharyngeal-type Flora Isolated.     Performed at [facility]   Report Status 11/11/2013 FINAL   Final  CULTURE, BLOOD (ROUTINE X 2)     Status: None   Collection Time    11/07/2013  3:25 PM      Result Value Ref Range Status   Specimen Description BLOOD LEFT HAND   Final   Special Requests BOTTLES DRAWN AEROBIC ONLY 2CC   Final   Culture  Setup Time     Final   Value: 10/31/2013 22:40     Performed at [facility]   Culture     Final   Value:        BLOOD CULTURE RECEIVED NO GROWTH TO DATE CULTURE WILL BE HELD FOR 5 DAYS BEFORE ISSUING A FINAL NEGATIVE REPORT     Performed at [facility]   Report Status PENDING   Incomplete      Assessment: 54 YOF with AMS and respiratory failure to continue on Unasyn and vancomycin for cellulitis and sepsis secondary to  aspiration PNA.  Patient's renal function is improving.  Vanc 5/25 >> Unasyn 5/25 >>  5/25 Bld x2 -  ngtd 5/25 Urine - neg 5/25 Sputum -NPF   Goal of Therapy:  Vancomycin trough level 15-20 mcg/ml   Plan: - Continue Unasyn 1.5gm IV Q12H - Change vanc to 1gm IV Q24H - Monitor renal fxn, clinical course, vanc trough as indicated - F/U insulin adjustment for better glycemic control    Ran Tullis D. Mina Marble, PharmD, BCPS Pager:  [phone] 11/12/2013, 7:22 AM

## 2013-11-12 NOTE — Progress Notes (Signed)
PULMONARY / CRITICAL CARE MEDICINE   Name: Shelley Glover MRN: [id number] DOB: [birth year]    ADMISSION DATE:  10/24/2013  REFERRING MD :  EDP PRIMARY SERVICE: PCCM  CHIEF COMPLAINT:  AMS, resp failure   BRIEF PATIENT DESCRIPTION:  80 yo female had fall w/o LOC on 5/24, seen by EMS and refused ER evaluation.  Found by caregiver 5/25 unresponsive.  In ER was hypothermic, hypoglycemia, dilated pupils, clenched jaw >> required nasal intubation in ED.  Hx of breast cancer s/p XRT with cellulitis and wound vac Rt axilla .  PCCM asked to admit.  SIGNIFICANT EVENTS: 5/25 Admit, neurology consulted  STUDIES:  CT head 5/25 >>> no acute abnormality EEG 5/26 >>> limited study, generalized slowing, no seizure activity MRI 5/27 >>> advanced white matter disease  LINES / TUBES: ETT 5/25 (nasal) >>> Lt IJ CVL 5/25 >>   CULTURES: BCx2 5/25>>>ng Urine 5/25>>>negative Sputum 5/25>>>oral flora  ANTIBIOTICS: Vanc 5/25>>>off unasyn 5/25>>>  SUBJECTIVE:  Open's eyes intermittently. Low grade fever UO ok  VITAL SIGNS: Temp:  [99.1 F (37.3 C)-100.8 F (38.2 C)] 99.1 F (37.3 C) (05/29 1500) Pulse Rate:  [108-133] 111 (05/29 1500) Resp:  [14-29] 22 (05/29 1500) BP: (101-171)/(36-74) 144/56 mmHg (05/29 1500) SpO2:  [96 %-100 %] 97 % (05/29 1500) FiO2 (%):  [30 %] 30 % (05/29 1500) Weight:  [81.9 kg (180 lb 8.9 oz)] 81.9 kg (180 lb 8.9 oz) (05/29 0500) VENTILATOR SETTINGS: Vent Mode:  [-] PRVC FiO2 (%):  [30 %] 30 % Set Rate:  [16 bmp] 16 bmp Vt Set:  [460 mL] 460 mL PEEP:  [5 cmH20] 5 cmH20 Plateau Pressure:  [15 cmH20-22 cmH20] 15 cmH20 INTAKE / OUTPUT: Intake/Output     05/28 0701 - 05/29 0700 05/29 0701 - 05/30 0700   I.V. (mL/kg) 215 (2.6) 35 (0.4)   NG/GT 1650 360   IV Piggyback 250 50   Total Intake(mL/kg) 2115 (25.8) 445 (5.4)   Urine (mL/kg/hr) 2280 (1.2) 550 (0.8)   Drains  10 (0)   Stool 225 (0.1)    Total Output 2505 560   Net -390 -115          PHYSICAL  EXAMINATION: General:chronically  ill appearing Neuro: comatose HEENT: pupils reactive, nasal endotracheal tube in place, OG tube in place Cardiovascular: regular Lungs: scattered rhonchi Abdomen:  Soft, non tender Musculoskeletal: 1+ ankle edema, Rt axilla wound vac in place  LABS:  CBC  Recent Labs Lab 11/10/13 0500 11/11/13 0427 11/12/13 0500  WBC 24.8* 16.1* 16.5*  HGB 9.7* 9.5* 10.0*  HCT 28.4* 28.3* 29.8*  PLT 285 301 340   Coag's  Recent Labs Lab 11/02/2013 0859  INR 1.22   BMET  Recent Labs Lab 11/10/13 0500 11/11/13 0427 11/12/13 0500  NA 130* 136* 138  K 3.8 3.2* 4.3  CL 95* 101 103  CO2 18* 18* 20  BUN 40* 50* 65*  CREATININE 2.52* 2.36* 2.07*  GLUCOSE 303* 288* 382*   Electrolytes  Recent Labs Lab 11/09/13 0500 11/10/13 0500 11/11/13 0427 11/12/13 0500  CALCIUM 7.1* 7.3* 7.5* 8.4  MG 0.9* 2.4  --   --   PHOS 3.6 4.5  --   --    Sepsis Markers  Recent Labs Lab 10/28/2013 0859 11/07/2013 1259  LATICACIDVEN 1.6  --   PROCALCITON  --  0.99   ABG  Recent Labs Lab 11/07/2013 0919  PHART 7.285*  PCO2ART 44.9  PO2ART 490.0*   Liver Enzymes  Recent Labs Lab 10/30/2013  0859 11/09/13 1124  AST 21 27  ALT 11 12  ALKPHOS 86 100  BILITOT 0.4 0.4  ALBUMIN 2.5* 2.1*   Cardiac Enzymes  Recent Labs Lab 19-Nov-2013 1301  TROPONINI <0.30   Glucose  Recent Labs Lab 11/11/13 1546 11/11/13 2018 11/11/13 2356 11/12/13 0428 11/12/13 0736 11/12/13 1209  GLUCAP 339* 353* 328* 363* 356* 325*    Imaging Mr Brain Wo Contrast  11/10/2013   CLINICAL DATA:  Altered mental status. The patient is unresponsive. Patient is on a ventilator with nasal intubation. The examination had to be discontinued prior to completion due to unstable airway.  EXAM: MRI HEAD WITHOUT CONTRAST  TECHNIQUE: Multiplanar, multiecho pulse sequences of the brain and surrounding structures were obtained without intravenous contrast.  COMPARISON:  CT head and cervical  spine from the same day.  FINDINGS: The diffusion-weighted images demonstrate no evidence for acute or subacute infarction.  No hemorrhage or mass lesion is present. Moderate periventricular and subcortical white matter changes are present bilaterally. Abnormal FLAIR signal within the subarachnoid spaces on the FLAIR sequences likely related to ventilation on oxygen tension.  Flow is present in the major intracranial arteries. The patient is status post left lens replacement. Mild mucosal thickening is present in the anterior ethmoid air cells and frontal sinuses bilaterally. There is a fluid level in the right sphenoid sinus. Minimal prior sub minimal fluid in the mastoid air cells may be related to nasal intubation. There is fluid in the nasopharynx.  IMPRESSION: 1. No evidence for acute or subacute infarction. 2. Age advanced white matter disease. This is nonspecific, but likely reflects the sequela of chronic microvascular ischemia. 3. Sinus disease is likely related to nasal intubation.   Electronically Signed   By: Lawrence Santiago M.D.   On: 11/10/2013 20:01   Dg Chest Port 1 View  11/12/2013   CLINICAL DATA:  Ventilator support.  Atelectasis.  EXAM: PORTABLE CHEST - 1 VIEW  COMPARISON:  11/11/2013  FINDINGS: Endotracheal tube has its tip 4.5 cm above the carina. Left internal jugular central line has its tip at the junction of the innominate vein and SVC. Nasogastric tube enters stomach. The lungs show an abnormal interstitial pattern which is slightly improved in the left lower lobe and slightly worsened in the upper lobes, more so on the right. The findings are most consistent with pneumonia, but interstitial edema could have a similar appearance. No dense consolidation, collapse or effusion. Changes of bilateral mastectomy again noted. No significant bony finding.  IMPRESSION: Lines and tubes well positioned. Mild patchy pulmonary density is improved in the left lower lobe and slightly worsened in the  upper lobes, more so on the right. Findings are most consistent with pneumonia. No dense consolidation or collapse.   Electronically Signed   By: Nelson Chimes M.D.   On: 11/12/2013 07:28   Dg Chest Port 1 View  11/11/2013   CLINICAL DATA:  Follow-up pneumonia  EXAM: PORTABLE CHEST - 1 VIEW  COMPARISON:  11/10/2013, 06/08/2012  FINDINGS: There is bilateral interstitial thickening which is similar to the prior exam. There is no pleural effusion or pneumothorax. Stable cardiomediastinal silhouette. Bilateral axillary surgical clips likely from prior axillary dissection. Unremarkable osseous structures.  IMPRESSION: Bilateral diffuse interstitial thickening likely reflecting chronic bronchitic changes. Mild superimposed interstitial edema or pneumonitis cannot be excluded.   Electronically Signed   By: Kathreen Devoid   On: 11/11/2013 08:05     ASSESSMENT / PLAN:  PULMONARY A: Acute respiratory failure 2nd  to acute encephalopathy and aspiration pneumonia. Hx of COPD. P:   Pressure support wean as tolerated >> mental status barrier to extubation PRN BD's  CARDIOVASCULAR A: Hypotension 2nd to hypovolemia and sedation >> resolved. Hx of HTN  P:  Monitor hemodynamics Restart ASA, lipitor 5/28 Hold outpt cozaar, propranolol, torsemide  RENAL A: Acute renal failure >> unknown baseline renal fx. Hyponatremia - mild >> improved. Hypokalemia. P:   F/u renal fx, urine outpt electrolytes  GASTROINTESTINAL A: Nutrition. Hx of GERD. P:   Tube feeds while on vent Protonix for SUP  HEMATOLOGIC A: Anemia of critical illness and chronic disease. Leukocytosis. P:  F/u CBC  Heparin for DVT proph   INFECTIOUS A: Sepsis 2nd to aspiration PNA. Chronic Rt axillary cellulitis s/p wound vac. P:   Ct unasyn,  vanc off  Wound care to follow  ENDOCRINE A: Hypoglycemia, hypothermia on admission >> resolved. Hx of DM. P:   SSI Increase  lantus 15 units bid  - avoid hypoglycemia Hold outpt  glucotrol  NEUROLOGIC A: Acute encephalopathy - concern for anoxia EEG - mod severe slowing P:   RASS goal 0  Int fent   Summary - Updated POA/ brother , Fritz Pickerel.   Neurology to assist with long term prognostication. They would consider withdrawal based on her known wishes if she does not recover mental status by Sunday   Care during the described time interval was provided by me and/or other providers on the critical care team.  I have reviewed this patient's available data, including medical history, events of note, physical examination and test results as part of my evaluation  CC time x 57m  Kara Mead MD. FCCP. [facility] Pager [phone] If no response call [phone]     11/12/2013, 3:45 PM

## 2013-11-12 NOTE — Progress Notes (Signed)
Pt became diaphoretic and tachypnic. HR 135-140 with accessory muscle use and RR of 49, peak pressure =58. We suctioned and levaged, and gave a dose of fentanyl 50 mcg per order. RT Heather suctioned a mucus plug from pt's ETT. She is currently with SaO2 of 96%, RR has returned to 24, HR is 130's and peak pressure has dropped to 27.. There is still obvious use of accessory muscles . We will continue to monitor.

## 2013-11-12 NOTE — Progress Notes (Signed)
NUTRITION FOLLOW UP  Intervention:    Change to 42M PEPuP Protocol with TF via OGT: Vital High Protein increase goal rate to 70 ml/h (1680 ml/day) without Prostat to provide 1680 kcals, 147 gm protein, 1404 ml free water daily.  Nutrition Dx:   Inadequate oral intake related to inability to eat as evidenced by NPO status, ongoing.  Goal:   Intake to meet >90% of estimated nutrition needs. Unmet, progressing.  Monitor:   TF tolerance/adequacy, weight trend, labs, vent status.  Assessment:   80 yo female with hx DM, HTN, breast cancer with significant radiation wounds to R axilla with wound VAC. Had accidental fall at home 5/24, no LOC, seen by EMS but refused ER. On 5/25 caregiver found pt unresponsive. Pt with agonal resps, hypothermic, hypoglycemic.   Patient is currently nasally intubated on ventilator support MV: 11.7 L/min Temp (24hrs), Avg:100.1 F (37.8 C), Min:99.2 F (37.3 C), Max:100.8 F (38.2 C)  Propofol off.  Patient is receiving TF via OGT with Vital High Protein at 60 ml/h with Prostat 30 ml once daily to provide 1540 kcals, 141 gm protein, 1204 ml free water daily.  Height: Ht Readings from Last 1 Encounters:  11-19-13 5\' 5"  (1.651 m)    Weight Status:   Wt Readings from Last 1 Encounters:  11/12/13 180 lb 8.9 oz (81.9 kg)  11/09/13  176 lb 5.9 oz (80 kg)   Re-estimated needs:  Kcal: 1755 Protein: 125-140 gm Fluid: 1.8-2 L  Skin: wound VAC to right axilla  Diet Order: NPO   Intake/Output Summary (Last 24 hours) at 11/12/13 1155 Last data filed at 11/12/13 0900  Gross per 24 hour  Intake   1885 ml  Output   2185 ml  Net   -300 ml    Last BM: 5/29   Labs:   Recent Labs Lab 11/19/2013 1700 11/09/13 0500 11/10/13 0500 11/11/13 0427 11/12/13 0500  NA 134* 131* 130* 136* 138  K 3.5* 3.6* 3.8 3.2* 4.3  CL 98 98 95* 101 103  CO2 19 17* 18* 18* 20  BUN 34* 35* 40* 50* 65*  CREATININE 1.85* 2.22* 2.52* 2.36* 2.07*  CALCIUM 7.5* 7.1* 7.3*  7.5* 8.4  MG  --  0.9* 2.4  --   --   PHOS  --  3.6 4.5  --   --   GLUCOSE 64* 148* 303* 288* 382*    CBG (last 3)   Recent Labs  11/11/13 2356 11/12/13 0428 11/12/13 0736  GLUCAP 328* 363* 356*    Scheduled Meds: . ampicillin-sulbactam (UNASYN) IV  1.5 g Intravenous Q12H  . antiseptic oral rinse  15 mL Mouth Rinse q12n4p  . aspirin  81 mg Oral Daily  . atorvastatin  10 mg Oral Daily  . chlorhexidine  15 mL Mouth Rinse BID  . feeding supplement (PRO-STAT SUGAR FREE 64)  30 mL Per Tube Daily  . heparin  5,000 Units Subcutaneous 3 times per day  . insulin aspart  0-9 Units Subcutaneous 6 times per day  . insulin glargine  10 Units Subcutaneous Daily  . ipratropium-albuterol  3 mL Nebulization Q6H  . pantoprazole sodium  40 mg Per Tube Q24H  . sodium chloride  10-40 mL Intracatheter Q12H  . vancomycin  1,000 mg Intravenous Q24H  . white petrolatum        Continuous Infusions: . feeding supplement (VITAL HIGH PROTEIN) 1,000 mL (11/12/13 0836)    Molli Barrows, RD, LDN, [place] Pager [phone] After Hours Pager  [phone]     

## 2013-11-13 ENCOUNTER — Inpatient Hospital Stay (HOSPITAL_COMMUNITY): Payer: Medicare Other

## 2013-11-13 LAB — GLUCOSE, CAPILLARY
GLUCOSE-CAPILLARY: 280 mg/dL — AB (ref 70–99)
GLUCOSE-CAPILLARY: 301 mg/dL — AB (ref 70–99)
GLUCOSE-CAPILLARY: 308 mg/dL — AB (ref 70–99)
Glucose-Capillary: 290 mg/dL — ABNORMAL HIGH (ref 70–99)
Glucose-Capillary: 359 mg/dL — ABNORMAL HIGH (ref 70–99)
Glucose-Capillary: 299 mg/dL — ABNORMAL HIGH (ref 70–99)

## 2013-11-13 LAB — CBC
HCT: 27.8 % — ABNORMAL LOW (ref 36.0–46.0)
HEMOGLOBIN: 9 g/dL — AB (ref 12.0–15.0)
MCH: 31.1 pg (ref 26.0–34.0)
MCHC: 32.4 g/dL (ref 30.0–36.0)
MCV: 96.2 fL (ref 78.0–100.0)
PLATELETS: 322 10*3/uL (ref 150–400)
RBC: 2.89 MIL/uL — ABNORMAL LOW (ref 3.87–5.11)
RDW: 16.1 % — ABNORMAL HIGH (ref 11.5–15.5)
WBC: 15.2 10*3/uL — AB (ref 4.0–10.5)

## 2013-11-13 LAB — BASIC METABOLIC PANEL
BUN: 75 mg/dL — ABNORMAL HIGH (ref 6–23)
CALCIUM: 8.9 mg/dL (ref 8.4–10.5)
CHLORIDE: 108 meq/L (ref 96–112)
CO2: 22 mEq/L (ref 19–32)
Creatinine, Ser: 1.93 mg/dL — ABNORMAL HIGH (ref 0.50–1.10)
GFR calc Af Amer: 27 mL/min — ABNORMAL LOW (ref >90)
GFR calc non Af Amer: 23 mL/min — ABNORMAL LOW (ref >90)
GLUCOSE: 329 mg/dL — AB (ref 70–99)
POTASSIUM: 4.8 meq/L (ref 3.7–5.3)
SODIUM: 143 meq/L (ref 137–147)

## 2013-11-13 MED ORDER — INSULIN GLARGINE 100 UNIT/ML ~~LOC~~ SOLN
20.0000 [IU] | Freq: Two times a day (BID) | SUBCUTANEOUS | Status: DC
  Administered 2013-11-13 – 2013-11-14 (×3): 20 [IU] via SUBCUTANEOUS
  Filled 2013-11-13 (×4): qty 0.2

## 2013-11-13 MED ORDER — SODIUM CHLORIDE 0.9 % IV SOLN
1.0000 mg/h | INTRAVENOUS | Status: DC
  Administered 2013-11-13: 2 mg/h via INTRAVENOUS
  Filled 2013-11-13 (×2): qty 10

## 2013-11-13 MED ORDER — INSULIN ASPART 100 UNIT/ML ~~LOC~~ SOLN
4.0000 [IU] | SUBCUTANEOUS | Status: DC
  Administered 2013-11-13 – 2013-11-14 (×7): 4 [IU] via SUBCUTANEOUS

## 2013-11-13 MED ORDER — SODIUM CHLORIDE 0.9 % IV SOLN
25.0000 ug/h | INTRAVENOUS | Status: DC
  Administered 2013-11-13: 100 ug/h via INTRAVENOUS
  Filled 2013-11-13: qty 50

## 2013-11-13 NOTE — Progress Notes (Signed)
Subjective: Patient remains unresponsive and intubated. No overnight adverse clinical events recorded.  Objective: Current vital signs: BP 147/53  Pulse 117  Temp(Src) 100.1 F (37.8 C) (Core (Comment))  Resp 26  Ht 5\' 5"  (1.651 m)  Wt 84.3 kg (185 lb 13.6 oz)  BMI 30.93 kg/m2  SpO2 98%  Neurologic Exam: Intubated and on mechanical ventilation. Breathing rate was rapid and breathing was somewhat labored. Response to noxious stimuli and was minimal. Pupils were equal and reacted normally to light. Extraocular movements were intact with right to left movements with oculocephalic maneuvers. No facial weakness was noted. Muscle tone was flaccid throughout. Patient had minimal withdrawal of extremities to noxious stimuli. Deep tendon reflexes were trace to 1+ and symmetrical. Plantars plantars were extensor bilaterally.  Medications: I have reviewed the patient's current medications.  Assessment/Plan: Encephalopathic state, most likely secondary to metabolic and toxic etiologies. Patient is showing no sign of improvement in responsiveness. Prognosis at this point is likely very poor for recovery to functional independence with, if she were to regain consciousness.  Recommend continuing supportive care to the extent of family's wishes. I will plan to see her in followup on an as needed basis following this visit.  C.R. Nicole Kindred, MD [facility] [phone]  11/13/2013  10:53 AM

## 2013-11-13 NOTE — Progress Notes (Signed)
Name: Shelley Glover MRN: [id number] DOB: 07-11-1931  ELECTRONIC ICU PHYSICIAN NOTE  Problem:  Restless/ hypertensive, better with bolus sedation  Intervention:  Start continuous sedation with fentanyl/versed for tonight with goal of  ? Comfort wean tomorrow    Tanda Rockers 11/13/2013, 5:47 PM

## 2013-11-13 NOTE — Progress Notes (Signed)
PULMONARY / CRITICAL CARE MEDICINE   Name: Shelley Glover MRN: [id number] DOB: [birth year]    ADMISSION DATE:  09-Nov-2013  REFERRING MD :  EDP PRIMARY SERVICE: PCCM  CHIEF COMPLAINT:  AMS, resp failure   BRIEF PATIENT DESCRIPTION:  80 yo female had fall w/o LOC on 5/24, seen by EMS and refused ER evaluation.  Found by caregiver 5/25 unresponsive.  In ER was hypothermic, hypoglycemia, dilated pupils, clenched jaw >> required nasal intubation in ED.  Hx of breast cancer s/p XRT with cellulitis and wound vac Rt axilla .  PCCM asked to admit.  SIGNIFICANT EVENTS: 5/25 Admit, neurology consulted  STUDIES:  CT head 5/25 >>> no acute abnormality EEG 5/26 >>> limited study, generalized slowing, no seizure activity MRI 5/27 >>> advanced white matter disease  LINES / TUBES: ETT 5/25 (nasal) >>> Lt IJ CVL 5/25 >>   CULTURES: BCx2 5/25>>>ng Urine 5/25>>>negative Sputum 5/25>>>oral flora  ANTIBIOTICS: Vanc 5/25>>>off unasyn 5/25>>>  SUBJECTIVE:  Coughs w/ sxn. Can't get her to open eyes. No purposeful movement. dyssynchronous on PSV   VITAL SIGNS: Temp:  [99.1 F (37.3 C)-100.4 F (38 C)] 100 F (37.8 C) (05/30 0700) Pulse Rate:  [109-133] 114 (05/30 0700) Resp:  [14-29] 24 (05/30 0700) BP: (101-174)/(36-82) 151/54 mmHg (05/30 0700) SpO2:  [96 %-99 %] 98 % (05/30 0700) FiO2 (%):  [30 %] 30 % (05/30 0300) Weight:  [84.3 kg (185 lb 13.6 oz)] 84.3 kg (185 lb 13.6 oz) (05/30 0500) VENTILATOR SETTINGS: Vent Mode:  [-] PRVC FiO2 (%):  [30 %] 30 % Set Rate:  [16 bmp] 16 bmp Vt Set:  [460 mL] 460 mL PEEP:  [5 cmH20] 5 cmH20 Plateau Pressure:  [14 cmH20-16 cmH20] 16 cmH20 INTAKE / OUTPUT: Intake/Output     05/29 0701 - 05/30 0700 05/30 0701 - 05/31 0700   I.V. (mL/kg) 115 (1.4)    NG/GT 1320    IV Piggyback 50    Total Intake(mL/kg) 1485 (17.6)    Urine (mL/kg/hr) 1315 (0.6)    Drains 10 (0)    Stool     Total Output 1325     Net +160            PHYSICAL  EXAMINATION: General:chronically  ill appearing, does not tol PSV Neuro: comatose HEENT: pupils reactive, nasal endotracheal tube in place, OG tube in place Cardiovascular: regular, tachy  Lungs: scattered rhonchi Abdomen:  Soft, non tender Musculoskeletal: generalized anasarca, Rt axilla wound vac in place  LABS:  CBC  Recent Labs Lab 11/11/13 0427 11/12/13 0500 11/13/13 0500  WBC 16.1* 16.5* 15.2*  HGB 9.5* 10.0* 9.0*  HCT 28.3* 29.8* 27.8*  PLT 301 340 322   Coag's  Recent Labs Lab 2013/11/09 0859  INR 1.22   BMET  Recent Labs Lab 11/11/13 0427 11/12/13 0500 11/13/13 0500  NA 136* 138 143  K 3.2* 4.3 4.8  CL 101 103 108  CO2 18* 20 22  BUN 50* 65* 75*  CREATININE 2.36* 2.07* 1.93*  GLUCOSE 288* 382* 329*   Electrolytes  Recent Labs Lab 11/09/13 0500 11/10/13 0500 11/11/13 0427 11/12/13 0500 11/13/13 0500  CALCIUM 7.1* 7.3* 7.5* 8.4 8.9  MG 0.9* 2.4  --   --   --   PHOS 3.6 4.5  --   --   --    Sepsis Markers  Recent Labs Lab 11/09/2013 0859 11-09-13 1259  LATICACIDVEN 1.6  --   PROCALCITON  --  0.99   ABG  Recent Labs Lab  10/25/2013 0919  PHART 7.285*  PCO2ART 44.9  PO2ART 490.0*   Liver Enzymes  Recent Labs Lab 11/07/2013 0859 11/09/13 1124  AST 21 27  ALT 11 12  ALKPHOS 86 100  BILITOT 0.4 0.4  ALBUMIN 2.5* 2.1*   Cardiac Enzymes  Recent Labs Lab 11/10/2013 1301  TROPONINI <0.30   Glucose  Recent Labs Lab 11/12/13 0736 11/12/13 1209 11/12/13 1559 11/12/13 1926 11/12/13 2357 11/13/13 0451  GLUCAP 356* 325* 340* 324* 280* 290*    Imaging Dg Chest Port 1 View  11/13/2013   CLINICAL DATA:  Endotracheal tube position  EXAM: PORTABLE CHEST - 1 VIEW  COMPARISON:  11/12/2013  FINDINGS: Endotracheal tube ends in the upper thoracic trachea. Unchanged positioning of an orogastric tube (ending in the upper stomach) and left IJ catheter (tip near the upper SVC). Normal heart size and mediastinal contours for technique. There  is chronic interstitial coarsening which is stable from prior. Reticular nodular opacities in the left more than right bases, unchanged from prior. Cannot exclude trace right pleural effusion. No pneumothorax or pulmonary edema. Bilateral breast surgery.  IMPRESSION: 1. Stable positioning of tubes and lines. 2. Unchanged bilateral pulmonary opacities that favor pneumonia.   Electronically Signed   By: Jorje Guild M.D.   On: 11/13/2013 06:38   Dg Chest Port 1 View  11/12/2013   CLINICAL DATA:  Ventilator support.  Atelectasis.  EXAM: PORTABLE CHEST - 1 VIEW  COMPARISON:  11/11/2013  FINDINGS: Endotracheal tube has its tip 4.5 cm above the carina. Left internal jugular central line has its tip at the junction of the innominate vein and SVC. Nasogastric tube enters stomach. The lungs show an abnormal interstitial pattern which is slightly improved in the left lower lobe and slightly worsened in the upper lobes, more so on the right. The findings are most consistent with pneumonia, but interstitial edema could have a similar appearance. No dense consolidation, collapse or effusion. Changes of bilateral mastectomy again noted. No significant bony finding.  IMPRESSION: Lines and tubes well positioned. Mild patchy pulmonary density is improved in the left lower lobe and slightly worsened in the upper lobes, more so on the right. Findings are most consistent with pneumonia. No dense consolidation or collapse.   Electronically Signed   By: Nelson Chimes M.D.   On: 11/12/2013 07:28   Maybe a little improved   ASSESSMENT / PLAN:  PULMONARY A: Acute respiratory failure 2nd to acute encephalopathy and aspiration pneumonia. Hx of COPD. P:   Pressure support wean as tolerated >> mental status barrier to extubation PRN BD's Will prob be one-way extubation this weekend   CARDIOVASCULAR A: Hypotension 2nd to hypovolemia and sedation >> resolved. Hx of HTN  P:  Monitor hemodynamics Restarted ASA, lipitor  5/28 Hold outpt cozaar, propranolol, torsemide  RENAL A: Acute renal failure >> unknown baseline renal fx.-->scr improving  Hyponatremia - mild >> improved. Hypokalemia. P:   F/u renal fx, urine outpt electrolytes  GASTROINTESTINAL A: Nutrition. Hx of GERD. P:   Tube feeds while on vent Protonix for SUP  HEMATOLOGIC A: Anemia of critical illness and chronic disease. Leukocytosis. P:  F/u CBC  Heparin for DVT proph   INFECTIOUS A: Sepsis 2nd to aspiration PNA. Chronic Rt axillary cellulitis s/p wound vac. P:   Ct unasyn, day 5/7 Wound care to follow  ENDOCRINE A: Hypoglycemia, hypothermia on admission >> resolved. Hx of DM. P:   SSI Increase  lantus 20 units bid & add scheduled novolog  -  avoid hypoglycemia Hold outpt glucotrol  NEUROLOGIC A: Acute encephalopathy - concern for anoxia EEG - mod severe slowing P:   RASS goal 0  Int fent  Summary :  Family planning on one way extubation if no sig improvement over weekend. Will not add anything at this point. Continue supportive care. Will likely extubate tomorrow with rapid transition to comfort oriented care   11/13/2013, 7:27 AM  Reviewed above, examined pt, and agree.  Updated family at bedside.  No improvement in mental status.  Likely proceed with vent withdrawal/comfort care on 5/31.  CC time 35 minutes.  Chesley Mires, MD [facility] Pulmonary/Critical Care 11/13/2013, 10:26 AM Pager:  [phone] After 3pm call: [phone]

## 2013-11-14 LAB — BASIC METABOLIC PANEL
BUN: 85 mg/dL — AB (ref 6–23)
CHLORIDE: 114 meq/L — AB (ref 96–112)
CO2: 24 mEq/L (ref 19–32)
Calcium: 8.9 mg/dL (ref 8.4–10.5)
Creatinine, Ser: 1.93 mg/dL — ABNORMAL HIGH (ref 0.50–1.10)
GFR calc Af Amer: 27 mL/min — ABNORMAL LOW (ref >90)
GFR calc non Af Amer: 23 mL/min — ABNORMAL LOW (ref >90)
GLUCOSE: 288 mg/dL — AB (ref 70–99)
POTASSIUM: 4.9 meq/L (ref 3.7–5.3)
Sodium: 149 mEq/L — ABNORMAL HIGH (ref 137–147)

## 2013-11-14 LAB — CBC
HEMATOCRIT: 26.1 % — AB (ref 36.0–46.0)
HEMOGLOBIN: 8.4 g/dL — AB (ref 12.0–15.0)
MCH: 31.7 pg (ref 26.0–34.0)
MCHC: 32.2 g/dL (ref 30.0–36.0)
MCV: 98.5 fL (ref 78.0–100.0)
Platelets: 292 10*3/uL (ref 150–400)
RBC: 2.65 MIL/uL — ABNORMAL LOW (ref 3.87–5.11)
RDW: 16.4 % — ABNORMAL HIGH (ref 11.5–15.5)
WBC: 11.9 10*3/uL — AB (ref 4.0–10.5)

## 2013-11-14 LAB — CULTURE, BLOOD (ROUTINE X 2)
CULTURE: NO GROWTH
Culture: NO GROWTH

## 2013-11-14 LAB — GLUCOSE, CAPILLARY
GLUCOSE-CAPILLARY: 232 mg/dL — AB (ref 70–99)
GLUCOSE-CAPILLARY: 275 mg/dL — AB (ref 70–99)
Glucose-Capillary: 232 mg/dL — ABNORMAL HIGH (ref 70–99)
Glucose-Capillary: 276 mg/dL — ABNORMAL HIGH (ref 70–99)

## 2013-11-14 MED ORDER — FREE WATER
200.0000 mL | Freq: Four times a day (QID) | Status: DC
  Administered 2013-11-14: 200 mL

## 2013-11-14 MED ORDER — MIDAZOLAM BOLUS VIA INFUSION
5.0000 mg | INTRAVENOUS | Status: DC | PRN
Start: 2013-11-14 — End: 2013-11-15
  Filled 2013-11-14: qty 20

## 2013-11-14 MED ORDER — INSULIN ASPART 100 UNIT/ML ~~LOC~~ SOLN: 8.0000 [IU] | SUBCUTANEOUS | Status: DC

## 2013-11-14 MED ORDER — FENTANYL BOLUS VIA INFUSION
50.0000 ug | INTRAVENOUS | Status: DC | PRN
  Filled 2013-11-14: qty 200

## 2013-11-14 MED ORDER — SODIUM CHLORIDE 0.9 % IV SOLN
25.0000 ug/h | INTRAVENOUS | Status: DC
  Administered 2013-11-14: 200 ug/h via INTRAVENOUS
  Filled 2013-11-14: qty 50

## 2013-11-14 MED ORDER — SODIUM CHLORIDE 0.9 % IV SOLN
0.0000 ug/h | INTRAVENOUS | Status: DC
  Filled 2013-11-14 (×2): qty 50

## 2013-11-14 NOTE — Progress Notes (Signed)
PULMONARY / CRITICAL CARE MEDICINE   Name: Shelley Glover MRN: [id number] DOB: 12/24/1931    ADMISSION DATE:  11/12/2013  REFERRING MD :  EDP PRIMARY SERVICE: PCCM  CHIEF COMPLAINT:  AMS, resp failure   BRIEF PATIENT DESCRIPTION:  80 yo female had fall w/o LOC on 5/24, seen by EMS and refused ER evaluation.  Found by caregiver 5/25 unresponsive.  In ER was hypothermic, hypoglycemia, dilated pupils, clenched jaw >> required nasal intubation in ED.  Hx of breast cancer s/p XRT with cellulitis and wound vac Rt axilla .  PCCM asked to admit.  SIGNIFICANT EVENTS: 5/25 Admit, neurology consulted  STUDIES:  CT head 5/25 >>> no acute abnormality EEG 5/26 >>> limited study, generalized slowing, no seizure activity MRI 5/27 >>> advanced white matter disease  LINES / TUBES: ETT 5/25 (nasal) >>> Lt IJ CVL 5/25 >>   CULTURES: BCx2 5/25>>>ng Urine 5/25>>>negative Sputum 5/25>>>oral flora  ANTIBIOTICS: Vanc 5/25>>>off unasyn 5/25>>>  SUBJECTIVE:  Agitated 5/30, now on cont sedation. No improvement   VITAL SIGNS: Temp:  [100 F (37.8 C)-100.8 F (38.2 C)] 100.8 F (38.2 C) (05/31 0700) Pulse Rate:  [105-138] 109 (05/31 0729) Resp:  [19-50] 20 (05/31 0729) BP: (111-171)/(38-66) 117/39 mmHg (05/31 0729) SpO2:  [95 %-98 %] 97 % (05/31 0729) FiO2 (%):  [30 %] 30 % (05/31 0729) Weight:  [84.3 kg (185 lb 13.6 oz)] 84.3 kg (185 lb 13.6 oz) (05/31 0500) VENTILATOR SETTINGS: Vent Mode:  [-] PRVC FiO2 (%):  [30 %] 30 % Set Rate:  [16 bmp] 16 bmp Vt Set:  [460 mL] 460 mL PEEP:  [5 cmH20] 5 cmH20 Plateau Pressure:  [15 ZHG99-24 cmH20] 15 cmH20 INTAKE / OUTPUT: Intake/Output     05/30 0701 - 05/31 0700 05/31 0701 - 06/01 0700   I.V. (mL/kg) 486 (5.8)    NG/GT 1320    IV Piggyback 150    Total Intake(mL/kg) 1956 (23.2)    Urine (mL/kg/hr) 1500 (0.7)    Drains     Total Output 1500     Net +456            PHYSICAL EXAMINATION: General:chronically  ill appearing, does not tol  PSV Neuro: comatose on sed gtt HEENT: pupils reactive, nasal endotracheal tube in place, OG tube in place Cardiovascular: regular, tachy  Lungs: scattered rhonchi Abdomen:  Soft, non tender Musculoskeletal: generalized anasarca, Rt axilla wound vac in place  LABS:  CBC  Recent Labs Lab 11/12/13 0500 11/13/13 0500 11/14/13 0500  WBC 16.5* 15.2* 11.9*  HGB 10.0* 9.0* 8.4*  HCT 29.8* 27.8* 26.1*  PLT 340 322 292   Coag's  Recent Labs Lab 11/10/2013 0859  INR 1.22   BMET  Recent Labs Lab 11/12/13 0500 11/13/13 0500 11/14/13 0500  NA 138 143 149*  K 4.3 4.8 4.9  CL 103 108 114*  CO2 _0 BUN 65* 75* 85*  CREATININE 2.07* 1.93* 1.93*  GLUCOSE 382* 329* 288*   Electrolytes  Recent Labs Lab 11/09/13 0500 11/10/13 0500  11/12/13 0500 11/13/13 0500 11/14/13 0500  CALCIUM 7.1* 7.3*  < > 8.4 8.9 8.9  MG 0.9* 2.4  --   --   --   --   PHOS 3.6 4.5  --   --   --   --   < > = values in this interval not displayed. Sepsis Markers  Recent Labs Lab 10/15/2013 0859 10/26/2013 1259  LATICACIDVEN 1.6  --   PROCALCITON  --  0.99   ABG  Recent Labs Lab 10/28/2013 0919  PHART 7.285*  PCO2ART 44.9  PO2ART 490.0*   Liver Enzymes  Recent Labs Lab 10/31/2013 0859 11/09/13 1124  AST 21 27  ALT 11 12  ALKPHOS 86 100  BILITOT 0.4 0.4  ALBUMIN 2.5* 2.1*   Cardiac Enzymes  Recent Labs Lab 10/29/2013 1301  TROPONINI <0.30   Glucose  Recent Labs Lab 11/13/13 1108 11/13/13 1554 11/13/13 1910 11/13/13 2330 11/14/13 0338 11/14/13 0700  GLUCAP 299* 301* 308* 275* 232* 232*    Imaging Dg Chest Port 1 View  11/13/2013   CLINICAL DATA:  Endotracheal tube position  EXAM: PORTABLE CHEST - 1 VIEW  COMPARISON:  11/12/2013  FINDINGS: Endotracheal tube ends in the upper thoracic trachea. Unchanged positioning of an orogastric tube (ending in the upper stomach) and left IJ catheter (tip near the upper SVC). Normal heart size and mediastinal contours for technique.  There is chronic interstitial coarsening which is stable from prior. Reticular nodular opacities in the left more than right bases, unchanged from prior. Cannot exclude trace right pleural effusion. No pneumothorax or pulmonary edema. Bilateral breast surgery.  IMPRESSION: 1. Stable positioning of tubes and lines. 2. Unchanged bilateral pulmonary opacities that favor pneumonia.   Electronically Signed   By: Jorje Guild M.D.   On: 11/13/2013 06:38   No new film   ASSESSMENT / PLAN:  PULMONARY A: Acute respiratory failure 2nd to acute encephalopathy and aspiration pneumonia. Hx of COPD. P:   Pressure support wean as tolerated >> mental status barrier to extubation PRN BD's Will prob be one-way extubation this weekend   CARDIOVASCULAR A: Hypotension 2nd to hypovolemia and sedation >> resolved. Hx of HTN  P:  Monitor hemodynamics Restarted ASA, lipitor 5/28 Hold outpt cozaar, propranolol, torsemide  RENAL A: Acute renal failure >> unknown baseline renal fx.-->scr stable Hypernatremia  . P:   F/u renal fx, urine outpt electrolytes Replace free water   GASTROINTESTINAL A: Nutrition. Hx of GERD. P:   Tube feeds while on vent Protonix for SUP  HEMATOLOGIC A: Anemia of critical illness and chronic disease. Leukocytosis. P:  F/u CBC  Heparin for DVT proph   INFECTIOUS A: Sepsis 2nd to aspiration PNA. Chronic Rt axillary cellulitis s/p wound vac. P:   Ct unasyn, day 6/7 Wound care to follow  ENDOCRINE A: Hypoglycemia, hypothermia on admission >> resolved. Hx of DM. P:   SSI cont lantus 20 units bid & adust scheduled novolog  - avoid hypoglycemia Hold outpt glucotrol  NEUROLOGIC A: Acute encephalopathy - concern for anoxia EEG - mod severe slowing Still no sig change. Neuro has s/o.  P:   RASS goal -2 Supportive care  Family meeting this am (5/31)-->discuss transition of care goals   Summary :  We will meet w/ family today. Anticipate extubation  and transition to comfort.   Marni Griffon ACNP-BC [place] Pager # [phone] OR # [phone] if no answer   11/14/2013, 7:52 AM  Reviewed above, and examined.  Met with pt's family.  Explained that she has not had any improvement in mental status.  They understand this, and do not want to see her suffering like this.  Decision made to proceed with vent withdrawal and comfort care.  Chesley Mires, MD [facility] Pulmonary/Critical Care 11/14/2013, 11:19 AM Pager:  [phone] After 3pm call: [phone]

## 2013-11-14 NOTE — Procedures (Signed)
Extubation Procedure Note  Patient Details:   Name: Shaili Donalson DOB: September 01, 1931 MRN: [id number]   Airway Documentation:     Evaluation  O2 sats: stable throughout Complications: No apparent complications Patient did not tolerate procedure well. Bilateral Breath Sounds: Diminished;Rhonchi Suctioning: Airway No  Pt terminally extubated to RA per MD order for terminal extubation.  RN at bedside, RT will continue to monitor.  Deetta Perla Closson 11/14/2013, 12:50 PM

## 2013-11-14 NOTE — Progress Notes (Signed)
Extubated to room air. Pt no code blue. See dr Halford Chessman notes. fentanly iv infusion continued. Patient in no distress.

## 2013-11-15 DEATH — deceased

## 2013-11-16 NOTE — Discharge Summary (Signed)
Shelley Glover was a 80 y.o. female admitted on 11/06/2013 with altered mental status.  She was found to have hypothermia, hypoglycemia, and hypotension.  She was intubated for airway protection.  CT head was unremarkable, and EEG showed generalized slowing.  She was treated with unasyn for aspiration pneumonia.  She had a chronic wound vac for Rt axilla cellulitis after radiation therapy for breast cancer.  Unfortunately her mental status did not improve, and she was not able to tolerate vent weaning.  Family was in agreement that she would not want to be sustained on life support like this.  She was made DNR and vent support withdrawn.  She subsequently expired on 11/15/13 at 345 AM.  Final Diagnoses: Acute encephalopathy Hypoglycemia Hypothermia Aspiration pneumonia Acute respiratory failure Rt axilla cellulitis Hx of COPD Hx of HTN Acute renal failure Hypernatremia Hx of GERD Anemia of critical illness Leukocytosis Sepsis Hx of DM  Chesley Mires, MD 12-12-13, 1:59 PM

## 2013-11-18 ENCOUNTER — Ambulatory Visit: Payer: Medicare Other | Admitting: Podiatrist

## 2013-11-24 ENCOUNTER — Other Ambulatory Visit: Payer: Self-pay | Admitting: Pulmonary Disease

## 2013-11-25 ENCOUNTER — Ambulatory Visit: Payer: Medicare Other | Admitting: Podiatrist

## 2013-12-15 NOTE — Progress Notes (Signed)
Patient time of death 42 on 2013-11-19. Paitent niece at bedside and informed of such. Time of death confirmed by this RN and [facility] Risk manager. Cardiac strip printed, [facility]ink called and notified MD, and [facility] notified of cardiac time of death. This RN and York Pellant RN wasted 36ml of Fentanyl and 21ml of versed down sink. Chaplain notified that family would like them by the bedside and niece called the rest of the patient's family. Family at bedside at this time.

## 2013-12-15 NOTE — Progress Notes (Signed)
Chaplain called for pt death to be with family.  Chaplain provided emotional and grief support as well as prayer.  Family appreciated the spiritual support.

## 2013-12-15 NOTE — Progress Notes (Signed)
UR Completed.  [name] [id number] 11/22/2013

## 2013-12-15 DEATH — deceased

## 2014-12-23 IMAGING — CR DG CHEST 1V PORT
1 series · 1 of 1 positions shown · non-contrast
Comparison: 11/11/2013

CLINICAL DATA: Ventilator support.  Atelectasis.

EXAM:
PORTABLE CHEST - 1 VIEW

[AP]
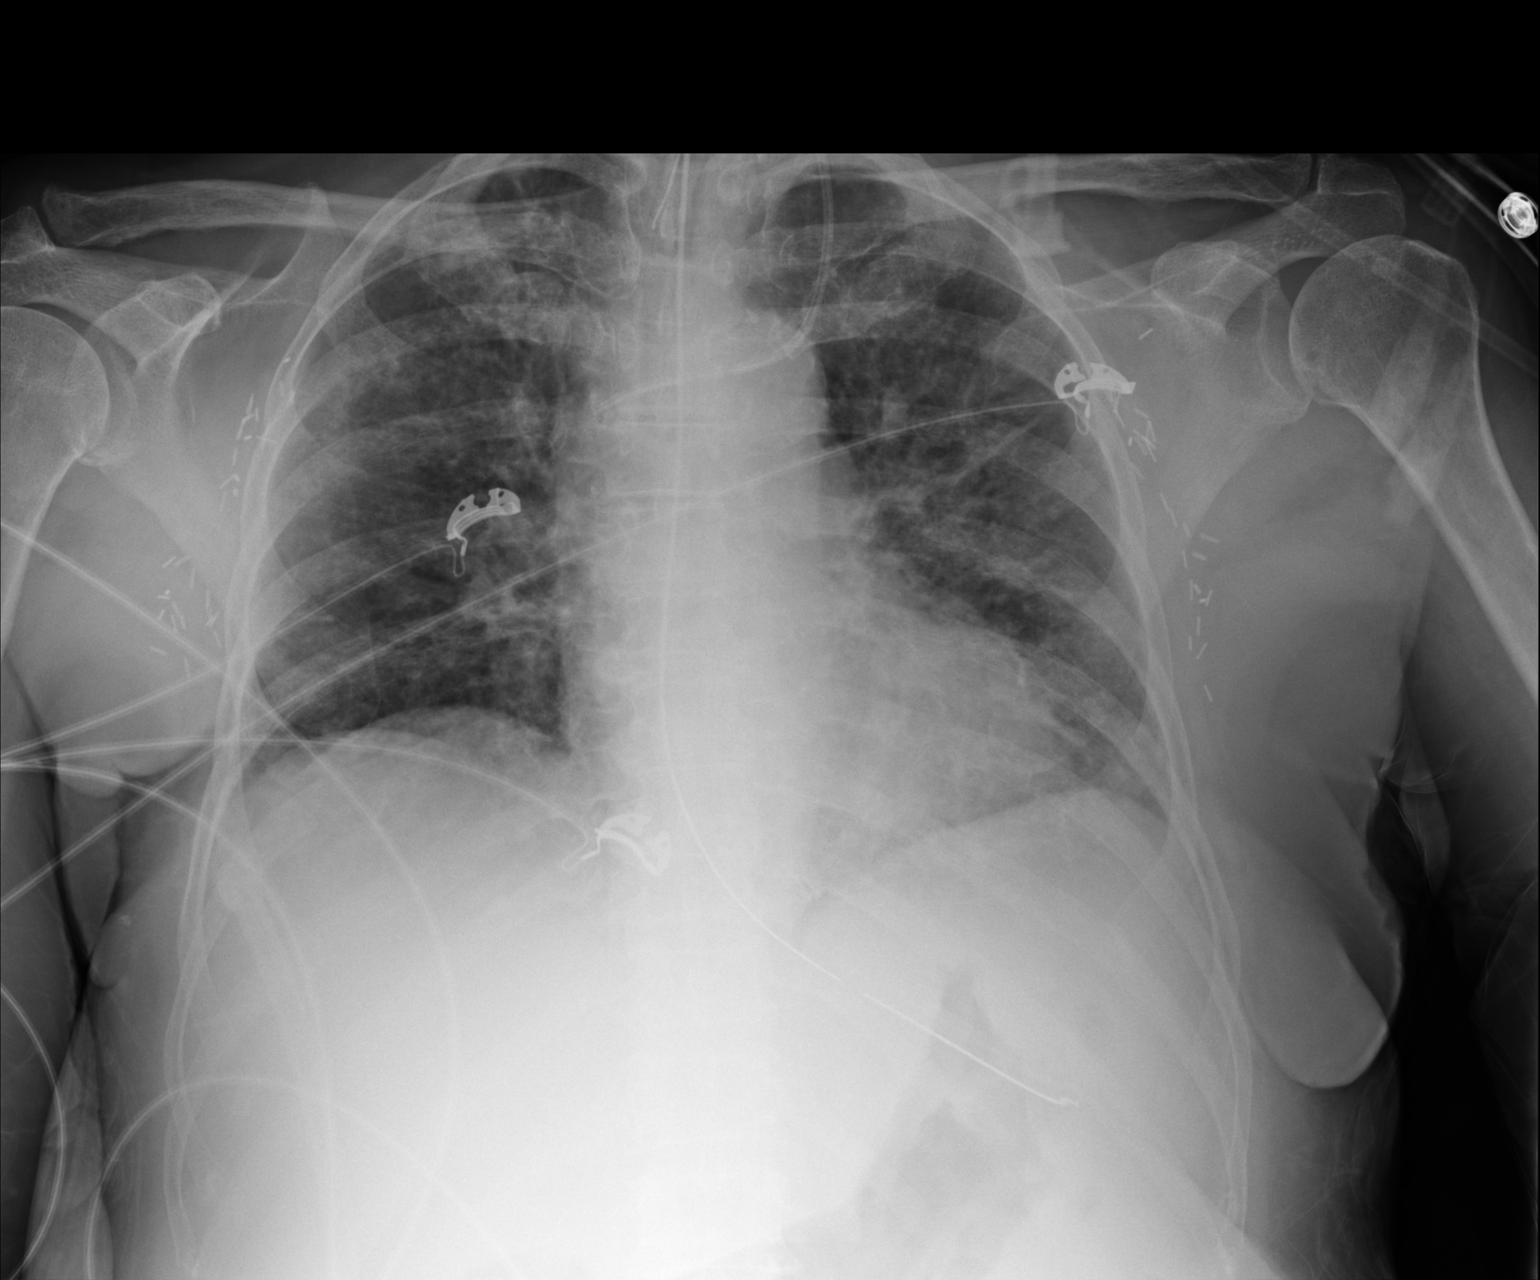

[1 of 1 positions shown; findings below may reference images not displayed]

FINDINGS: Endotracheal tube has its tip 4.5 cm above the carina. Left internal
jugular central line has its tip at the junction of the innominate
vein and SVC. Nasogastric tube enters stomach. The lungs show an
abnormal interstitial pattern which is slightly improved in the left
lower lobe and slightly worsened in the upper lobes, more so on the
right. The findings are most consistent with pneumonia, but
interstitial edema could have a similar appearance. No dense
consolidation, collapse or effusion. Changes of bilateral mastectomy
again noted. No significant bony finding.
IMPRESSION: Lines and tubes well positioned. Mild patchy pulmonary density is
improved in the left lower lobe and slightly worsened in the upper
lobes, more so on the right. Findings are most consistent with
pneumonia. No dense consolidation or collapse.
